# Patient Record
Sex: Female | Born: 1950 | Race: White | Hispanic: No | Marital: Single | Smoking: Never smoker
Health system: Southern US, Community
[De-identification: ages and names within clinical notes are randomized; demographics above are authoritative.]

---

## 1998-05-11 ENCOUNTER — Encounter: Admission: RE | Admit: 1998-05-11 | Discharge: 1998-05-11 | Payer: Self-pay | Admitting: *Deleted

## 2008-02-26 ENCOUNTER — Encounter
Admission: RE | Admit: 2008-02-26 | Discharge: 2008-02-27 | Payer: Self-pay | Admitting: Physical Medicine & Rehabilitation

## 2008-02-27 ENCOUNTER — Ambulatory Visit: Payer: Self-pay | Admitting: Physical Medicine & Rehabilitation

## 2009-01-07 ENCOUNTER — Encounter
Admission: RE | Admit: 2009-01-07 | Discharge: 2009-04-07 | Payer: Self-pay | Admitting: Physical Medicine & Rehabilitation

## 2009-01-11 ENCOUNTER — Ambulatory Visit: Payer: Self-pay | Admitting: Physical Medicine & Rehabilitation

## 2009-01-24 ENCOUNTER — Ambulatory Visit (HOSPITAL_BASED_OUTPATIENT_CLINIC_OR_DEPARTMENT_OTHER)
Admission: RE | Admit: 2009-01-24 | Discharge: 2009-01-24 | Payer: Self-pay | Admitting: Physical Medicine & Rehabilitation

## 2009-01-24 ENCOUNTER — Ambulatory Visit: Payer: Self-pay | Admitting: Diagnostic Radiology

## 2009-02-08 ENCOUNTER — Ambulatory Visit: Payer: Self-pay | Admitting: Physical Medicine & Rehabilitation

## 2009-03-14 ENCOUNTER — Ambulatory Visit: Payer: Self-pay | Admitting: Physical Medicine & Rehabilitation

## 2009-03-23 ENCOUNTER — Encounter
Admission: RE | Admit: 2009-03-23 | Discharge: 2009-04-05 | Payer: Self-pay | Admitting: Physical Medicine & Rehabilitation

## 2009-03-23 ENCOUNTER — Encounter
Admission: RE | Admit: 2009-03-23 | Discharge: 2009-03-23 | Payer: Self-pay | Admitting: Physical Medicine & Rehabilitation

## 2009-04-01 ENCOUNTER — Encounter
Admission: RE | Admit: 2009-04-01 | Discharge: 2009-05-31 | Payer: Self-pay | Admitting: Physical Medicine & Rehabilitation

## 2009-04-05 ENCOUNTER — Ambulatory Visit: Payer: Self-pay | Admitting: Physical Medicine & Rehabilitation

## 2009-05-31 ENCOUNTER — Ambulatory Visit: Payer: Self-pay | Admitting: Physical Medicine & Rehabilitation

## 2009-06-23 ENCOUNTER — Encounter
Admission: RE | Admit: 2009-06-23 | Discharge: 2009-08-29 | Payer: Self-pay | Admitting: Physical Medicine & Rehabilitation

## 2009-06-27 ENCOUNTER — Ambulatory Visit: Payer: Self-pay | Admitting: Physical Medicine & Rehabilitation

## 2009-08-29 ENCOUNTER — Ambulatory Visit: Payer: Self-pay | Admitting: Physical Medicine & Rehabilitation

## 2010-02-10 ENCOUNTER — Encounter
Admission: RE | Admit: 2010-02-10 | Discharge: 2010-05-11 | Payer: Self-pay | Admitting: Physical Medicine & Rehabilitation

## 2010-02-14 ENCOUNTER — Ambulatory Visit: Payer: Self-pay | Admitting: Physical Medicine & Rehabilitation

## 2010-02-14 ENCOUNTER — Ambulatory Visit (HOSPITAL_COMMUNITY)
Admission: RE | Admit: 2010-02-14 | Discharge: 2010-02-14 | Payer: Self-pay | Admitting: Physical Medicine & Rehabilitation

## 2010-03-13 ENCOUNTER — Ambulatory Visit: Payer: Self-pay | Admitting: Physical Medicine & Rehabilitation

## 2010-05-30 ENCOUNTER — Encounter
Admission: RE | Admit: 2010-05-30 | Discharge: 2010-05-30 | Payer: Self-pay | Source: Home / Self Care | Admitting: Physical Medicine & Rehabilitation

## 2010-10-18 ENCOUNTER — Encounter
Admission: RE | Admit: 2010-10-18 | Discharge: 2010-10-20 | Payer: Self-pay | Source: Home / Self Care | Attending: Physical Medicine & Rehabilitation | Admitting: Physical Medicine & Rehabilitation

## 2010-10-20 ENCOUNTER — Ambulatory Visit
Admission: RE | Admit: 2010-10-20 | Discharge: 2010-10-20 | Payer: Self-pay | Source: Home / Self Care | Attending: Physical Medicine & Rehabilitation | Admitting: Physical Medicine & Rehabilitation

## 2010-10-24 NOTE — Assessment & Plan Note (Signed)
REASON FOR VISIT:  Increased back pain.  SECONDARY COMPLAINTS:  Bilateral hip pain, right groin pain, bilateral elbow, and bilateral shoulder pain.  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  She has had a long absence from clinic.  She knows she has had some events in her personal life that have caused this absence.  She feels like her back pain is back.  The last set of injections helped a couple of months at least.  She also has additional complaints of right greater than left hip pain which is exacerbated when she tries to sleep on her side.  She also has some pain with ambulation. In addition, she has right greater than left groin pain.  She has pain in the anterior shin on the right side.  She has had to pad her orthosis with some foam, has lost some weight, and has poor fit.  She is also complaining of some decreased balance as well.  She has pain that interferes activity at a moderate level.  Her average pain is 8, worst pain is in the low back.  Her pain is worsened with prolonged sitting, improves with medications and injections.  Relief from meds is fair. Her pain meds include tramadol 50 mg t.i.d. she has taken some trazodone 50 mg at bedtime for sleep.  REVIEW OF SYSTEMS:  Also positive for numbness and tingling in the right thigh, dizziness, anxiety, night sweats, poor appetite, and sleep apnea.  SOCIAL HISTORY:  Married, smokes a pack in 2 days.  PHYSICAL EXAMINATION:  VITAL SIGNS:  Blood pressure 122/73, pulse 79, respirations 18, and O2 saturation 99% room air. GENERAL:  No acute distress.  Orientation x3.  Affect alert.  Gait is normal.  Other than having some mild right hip hiking, she does wear the right AFO. MUSCULOSKELETAL:  Examination of right lower extremity, no  skin breakdown.  She does have some prominence over anterior tibia.  She has an extremely atrophic leg below the knee and this is chronic.  She has poor fit of her AFO, and there is no skin breakdown.  Her back has some tenderness of the lumbosacral junction.  She has pain with extension greater than with flexion.  The straight leg raising test is negative. She has no pain with hip range of motion, but she does have tenderness over the right greater than left greater trochanter.  Her shoulders have negative impingement sign.  Full range of motion.  Does have some pain in the subacromial area.  No pain over the [facility] joint.  Elbows showed no evidence of swelling.  No evidence of bursitis.  No skin discoloration. Normal range of motion.  IMPRESSION: 1. Lumbar spondylosis resulting low back pain, chronic and severe.  We     will set her up for another set of medial branch blocks. 2. Post polio syndrome, chronic right foot drop.  She has lost some     additional muscle mass causing the AFO to fit poorly.  I will send     her back to [facility], attention Roland Earl, to revise orthosis which is about 62 years old. 3. Hip pain, trochanteric bursitis, right greater than left side.  I     do not detect any intra-articular pathology.  With range of motion  now it is also in the differential given her groin pain, we will     inject the greater trochanter on the right side today and observe     result. 4. Shoulder pain, mainly nocturnal, cannot elicit an impingement sign,     question whether this could be     representative of cuff tear.  For now, this is secondary complaint     but may need ultrasound of shoulder to further evaluate.  Discussed     with the patient and agrees with plan.  We will resume tramadol on     trazodone.     Erick Colace, M.D. Electronically Signed    AEK/MedQ D:  10/20/2010 12:51:10  T:  10/20/2010 23:20:48  Job #:   [id number]

## 2010-10-30 ENCOUNTER — Ambulatory Visit (HOSPITAL_BASED_OUTPATIENT_CLINIC_OR_DEPARTMENT_OTHER): Payer: BC Managed Care – HMO | Admitting: Physical Medicine & Rehabilitation

## 2010-10-30 ENCOUNTER — Ambulatory Visit: Payer: BC Managed Care – HMO | Attending: Physical Medicine & Rehabilitation

## 2010-10-30 DIAGNOSIS — M47817 Spondylosis without myelopathy or radiculopathy, lumbosacral region: Secondary | ICD-10-CM

## 2010-12-01 ENCOUNTER — Ambulatory Visit: Payer: BC Managed Care – HMO | Admitting: Physical Medicine & Rehabilitation

## 2010-12-01 ENCOUNTER — Encounter: Payer: BC Managed Care – HMO | Attending: Physical Medicine & Rehabilitation

## 2011-02-06 NOTE — Group Therapy Note (Signed)
REQUESTING PHYSICIAN:  Dr. Guss Bunde.   HISTORY:  Carrie Travis is a 60 year old female who was kindly referred by  Dr. Guss Bunde for evaluation of back pain, mainly right-sided.  She has a history of post-polio syndrome, diagnosed in 1995, followed in  [place] for a number of years but only intermittently.  Her polio  onset is back in 1951, right lower limb.  She has been using an AFO to  ambulate for a number of years, i.e., about 15.   Past history is significant for depression.  Has been seen by  psychiatry.   PAST SURGICAL HISTORY:  Ovarian cyst removal in 2008.  Some type of leg  surgery in 1960 to the right lower extremity at [facility].   SOCIAL HISTORY:  Architectural technologist at [employer].  One level  home.  Lives with her husband and daughter.  Smokes 5-8 cigarettes per  day.  Alcohol use about every other day.   Pain level is 7-8/10, described as aching, mainly right-sided, low back,  going to the scapula to the upper buttock area.  Left side, just over  the lower lumbar area.  Her pain interferes with activity at a moderate  level.  She can walk all day at work, using the AFO.  She climbs steps,  she drives.  She transfers alone.   REVIEW OF SYSTEMS:  Positive for numbness, tingling, dizziness, anxiety,  and night sweats.   FAMILY HISTORY:  Diabetes, high blood pressure.   PHYSICAL EXAMINATION:  Her blood pressure is 137/70, pulse 64,  respiratory rate 15, O2 sat 100% on room air.  GENERAL:  A well-developed female in no acute distress.  Orientation x3.  Affect is alert.  Gait has a limp.  Has a foot-slap-type gait without her AFO on.  She has  an atrophic right lower extremity, particularly below the knee.  Her  neck range of motion is normal.  Upper extremity strength and range of  motion is normal.  Upper extremity deep tendon reflexes are 2+ biceps,  triceps, brachial radialis.  In the right lower extremity, she has -3  hip flexion, 0  at the knee extensors, 0 at the ankle dorsiflexor and  plantar flexor, and -2 at the hamstrings on the right.  On the left  side, she has 5/5 strength in the same muscle groups.  She has  tenderness to palpation in the thoracic and paraspinals, more on the  right side than the left side.  She also had some rib pain around the  right lateral ribs.  She indicates she had a fall last week.  Sensation is mildly reduced, right foot compared to left foot.   IMPRESSION:  1. Chronic low back pain.  She has some associated right foot      numbness, question whether this is related.  Alternatively, could      have peroneal neuropathy based on chronic AFO usage; however, her      sensation deficits are both in the tibial and peroneal      distribution.  For this reason, I think MRI of the lumbar spine would be helpful, and  consider for lumbar injections once results are reviewed.  1. Balance disorder, mainly due to right lower extremity weakness.      May have some sensory component as well.  She would like to do a      free balance assessment, which is done at one of the [facility]  Physical Therapy sites, and depending on the results of this, might      go through a balance program.   I will keep you apprised of her progress.  Will check urine drug screen,  should we decide to initiate some type of narcotic analgesics, although  she is really not looking for these at the current time.   Thanks for this interesting consultation.      Carrie Travis, M.D.  Electronically Signed     AEK/MedQ  D:  02/27/2008 16:29:31  T:  02/27/2008 18:14:58  Job #:  [id number]   cc:   Guss Bunde  Fax: [phone]

## 2011-02-06 NOTE — Procedures (Signed)
NAME:  Carrie Travis, Carrie Travis                ACCOUNT NO.:  [id number]   MEDICAL RECORD NO.:  [id number]           PATIENT TYPE:   LOCATION:                                 FACILITY:   PHYSICIAN:  Erick Colace, M.D.DATE OF BIRTH:  04-08-1951   DATE OF PROCEDURE:  DATE OF DISCHARGE:                               OPERATIVE REPORT   PROCEDURE:  Left subacromial bursa injection.   INDICATIONS:  Subacromial bursitis.  Pain only partially responsive to  medication management and other conservative care.   Informed consent was obtained after describing the risks and benefits of  the procedure with the patient.  These include bleeding, bruising,  infection.  She elects to proceed and has given written consent.  The  patient placed in the seated position and posterolateral approach  utilized.  Area marked, prepped with Betadine, entered with 25-gauge 1-  1/2-inch needle, inserted to one-half depth, followed by injection of 1  mL of 40 mg/mL Depo-Medrol and 4 mL of 1% lidocaine.  The patient  tolerated the procedure well.  Pre and post injection vitals stable, and  post injection instructions given.  Referred to Physical Therapy for  some external rotation, strengthening, internal rotation, and  stretching.      Erick Colace, M.D.  Electronically Signed     AEK/MEDQ  D:  04/05/2009 16:10:96  T:  04/06/2009 00:54:33  Job:  [id number]

## 2011-02-06 NOTE — Assessment & Plan Note (Signed)
History of postpolio syndrome, diagnosed 1995.  Polio onset 1951 right  lower limb been using AFO to ambulate for about 15 years for fitted AFO,  progressive atrophy of the right calf muscle.  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  Smokes a pack per day.   Main complaint today is back pain mainly axial.  No lower extremity  radiating pattern.   She in addition has shoulder pain mainly on the left side greater than  right side.  She has pain when she sleeps on left side.   Her Oswestry score today is 38%.  I will fax this to [phone]  [facility]   Pain score is 6-7/10, mainly in the back as well as involving the right  knee.   REVIEW OF SYSTEMS:  Please see health and history form for 14-point  review.  Pertinent positives include trouble walking, dizziness,  anxiety, constipation, night sweats, shortness of breath.   SOCIAL:  In addition, married, lives with her husband.   PHYSICAL EXAMINATION:  VITAL SIGNS:  Blood pressure 131/67, pulse 79,  respirations 80, O2 sat 99% on room air.  GENERAL:  No acute distress.  Orientation x3.  Affect alert.  Gait is  with a limp.   She has calf atrophy on the right side.  Absent ankle range of motion  actively.  She has ankle contracture as well.   Knee extension is good, 4/5 strength left lower extremities, 5/5  strength upper extremities 5/5.  She has positive impingement sign of  left shoulder compared to the right side.  Negative AC joint  compression.   IMPRESSION:  1. Chronic low back pain.  She has pain mainly with extension of the      lumbar spine relatively relieved by flexion.  I think she may have      a lumbar facet syndrome and will set her up for medial branch      blocks.  2. Right foot drop, post polio syndrome with calf atrophy, poor fit of      ankle-foot orthosis.  I think she may benefit  from another ankle-      foot orthosis possibly carbon fiber versus plastic.  3. Shoulder pain likely glenohumeral versus subacromial bursitis.  We      will try some Voltaren gel and failing this may set her up for      injection.   Continue her prescription tramadol 50 t.i.d.  I reviewed her x-rays with  her of her shoulder and certain questions regarding the fluoroscopically  guided injection.      Erick Colace, M.D.  Electronically Signed    AEK/MedQ  D:  02/08/2009 16:19:25  T:  02/09/2009 05:10:30  Job #:  [id number]   cc:   Roland Earl  Fax: [phone]

## 2011-02-06 NOTE — Assessment & Plan Note (Signed)
Ms. Westendorf is a followup visit for a patient I saw about 1 year ago and  was lost to follow up over the course of 1 year.  She has a history  postpolio syndrome diagnosed in 1995.  She had polio onset in 1951,  right lower limb, has been using AFO to ambulate for about 15 years.  Now, she has had some poor fit with her AFO as she has had progressive  atrophy in the right calf muscle.   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  One-level home, lives with her husband  and daughter.  She is smoking a pack a day.  She states that she has had  a lot of family issues over the course of the last year.   She has been on hydrocodone 7.5/750 in the past due to recent surgery.  When I saw on March 10, 2008, we did urine drug screen which showed  propoxyphene is as expected.  She had some alprazolam, which was  prescribed by physician.   Her Oswestry score is 46%.  Her other issues include right hip pain as  well as pain in her back, pain in the shoulder region, her shoulder  region pain is mainly when she reaches across her body, her pain score  is in the 8/10 range, but currently 4.  Pain is worse with standing and  inactivity; improves with medications.   She has a review of systems positive for anxiety, dizziness, numbness,  weakness.   Her blood pressures 131/91, pulse 73, respirations 18, O2 sat 98% on  room air.  Physical examination, she has some AC joint discomfort to palpation,  cross adduction test positive, impingement signs are negative.  She has  reduced triceps strength at 4/5; biceps, deltoid, hand strength are 5-.  Her lower extremity has 5 bilateral hip flexors, knee extensors, but  only 2 at the right knee flexor and 1 at the ankle dorsiflexor, 2 at the  toe flexors and extensors on the right and 5 in the same muscle groups  on the left side.  Sensation is intact in bilateral upper and lower  extremities.  Deep tendon  reflexes are trace at  right knee, 2 at the  left knee, 1 at the right ankle, and 2 at the left ankle.   IMPRESSION:  Chronic low back pain, right foot numbness has been  chronic.  She does have some weakness in the right lower extremity which  I believe is due to postpolio syndrome, but this has some aching in the  right thigh which could either be related to her back or post polio  syndrome.  Certainly, peroneal neuropathy based on chronic AFO usage is  possibility as well especially since she has had to modify her brace due  to poor fit.   Balance disorder is multiple multifactorial.  We will send her to  physical therapy for this.   Shoulder pain, I believe this is AC joint arthritis, we will check 2-  views of the shoulder.   I will see her back in a month to review x-rays.  Have given her Rx for  tramadol 50 mg t.i.d. to see how this works for her.   I discussed plan with the patient, she is agreement.      Erick Colace, M.D.  Electronically Signed     AEK/MedQ  D:  01/11/2009 14:56:27  T:  01/12/2009 04:00:07  Job #:  [id number] 

## 2011-02-06 NOTE — Procedures (Signed)
NAME:  Carrie Travis, Carrie Travis                ACCOUNT NO.:  [id number]   MEDICAL RECORD NO.:  [id number]           PATIENT TYPE:   LOCATION:                                 FACILITY:   PHYSICIAN:  Erick Colace, M.D.DATE OF BIRTH:  [birth year]   DATE OF PROCEDURE:  03/14/2009  DATE OF DISCHARGE:                               OPERATIVE REPORT   PROCEDURES:  This is bilateral L5 dorsal ramus injection, bilateral L4  medial branch block, bilateral L3 medial branch block under fluoroscopic  guidance.   INDICATIONS:  Lumbar pain mainly with extension, no radicular component.   Informed consent obtained after describing risks and benefits of the  procedure with the patient's pain does limit her in terms of her  mobility and is only partially responsive to medication management.   The patient was placed prone on fluoroscopy table.  Betadine prep,  sterile drape, 25-gauge inch and a half needle was used to anesthetize  the skin and subcutaneous tissue with 1% lidocaine x1.5 mL at each of 6  sites.  Then, a 22-guage 3-1/2-inch spinal needle was inserted under  fluoroscopic guidance targeting the left S1 SAP sacral alar junction.  Bone contact made confirmed with lateral imaging.  Omnipaque 180 x0.5 mL  demonstrated no intravascular uptake, then 0.5 mL of solution containing  4 mg/mL of dexamethasone and 2 mL of 2% MPF lidocaine and then left L5  SAP transverse junction targeted bone contact made confirmed with  lateral imaging.  Omnipaque 180 under live fluoro demonstrated no  intravascular uptake, then 0.5 mL demonstrated no intravascular uptake.  Then, 0.5 end of dexamethasone-lidocaine solution was injected.  Then  left L4 SAP transverse process junction targeted bone contact made.  Omnipaque 180 under live fluoro demonstrated no intravascular uptake,  then 0.5 of dexamethasone-lidocaine solution was injected.  The same  procedure was repeated on the right side using same needle injectate  technique.  The patient tolerated the procedure well.  Her Oswestry  score was 42% today.  Pre- and post-injection vitals stable.  Post-  injection instructions given.  We will follow up on her pain diary.      Erick Colace, M.D.  Electronically Signed     AEK/MEDQ  D:  03/14/2009 11:45:30  T:  03/15/2009 01:07:14  Job:  [id number]

## 2011-12-31 IMAGING — CR DG CERVICAL SPINE COMPLETE 4+V
7 series · 7 of 7 positions shown · non-contrast
Comparison: None.

CLINICAL DATA: Neck pain.  Left shoulder pain.

CERVICAL SPINE - COMPLETE 4+ VIEW

[w c-spine lat]
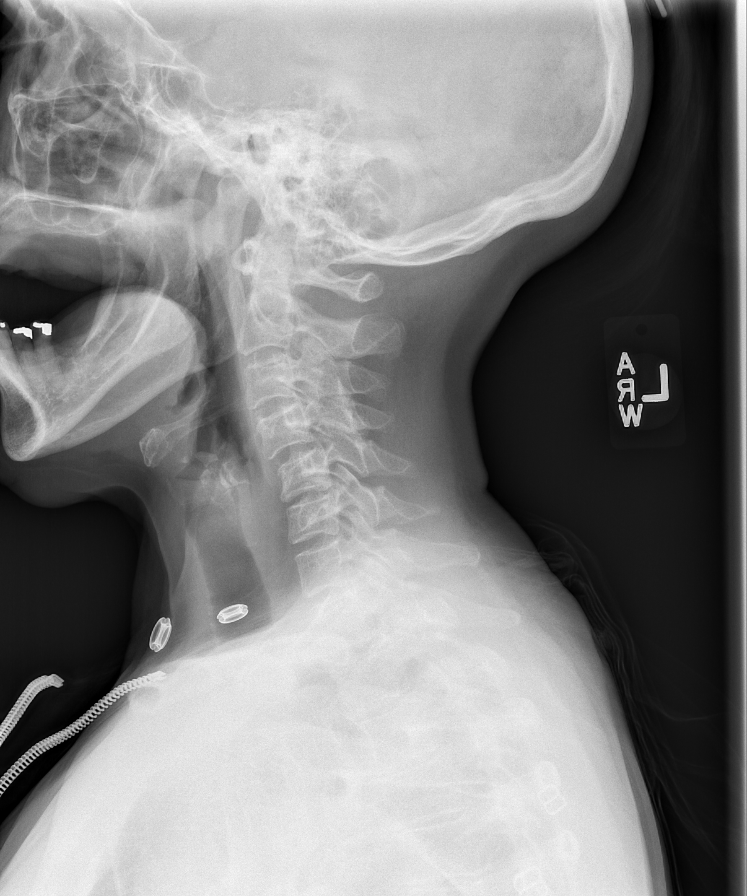

[w c-spine oblique (1 of 2)]
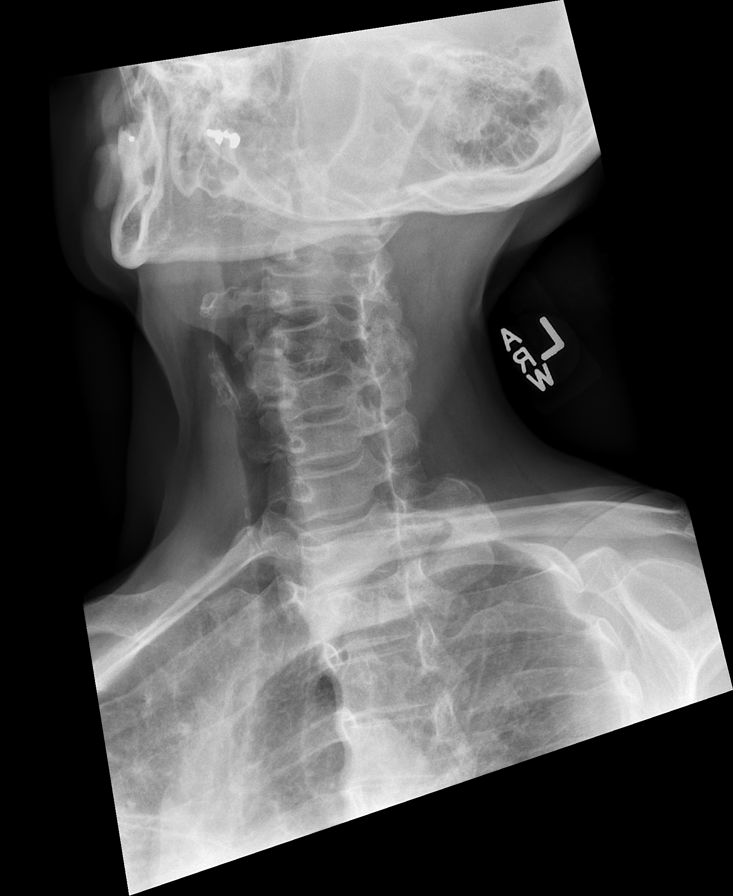

[w c-spine oblique (2 of 2)]
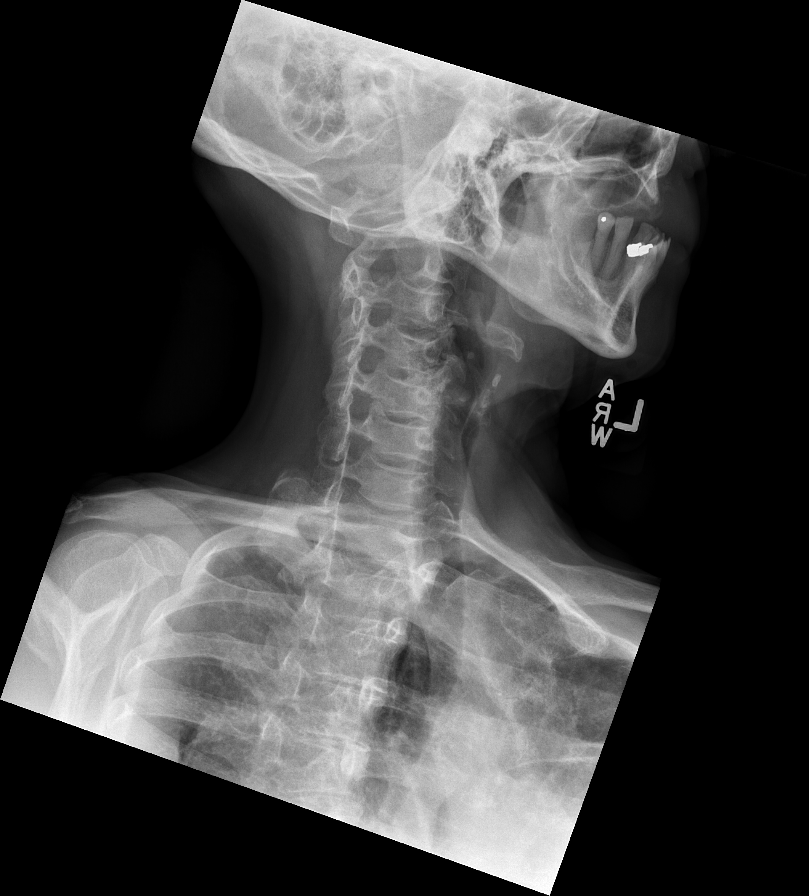

[w c-spine a.p. *]
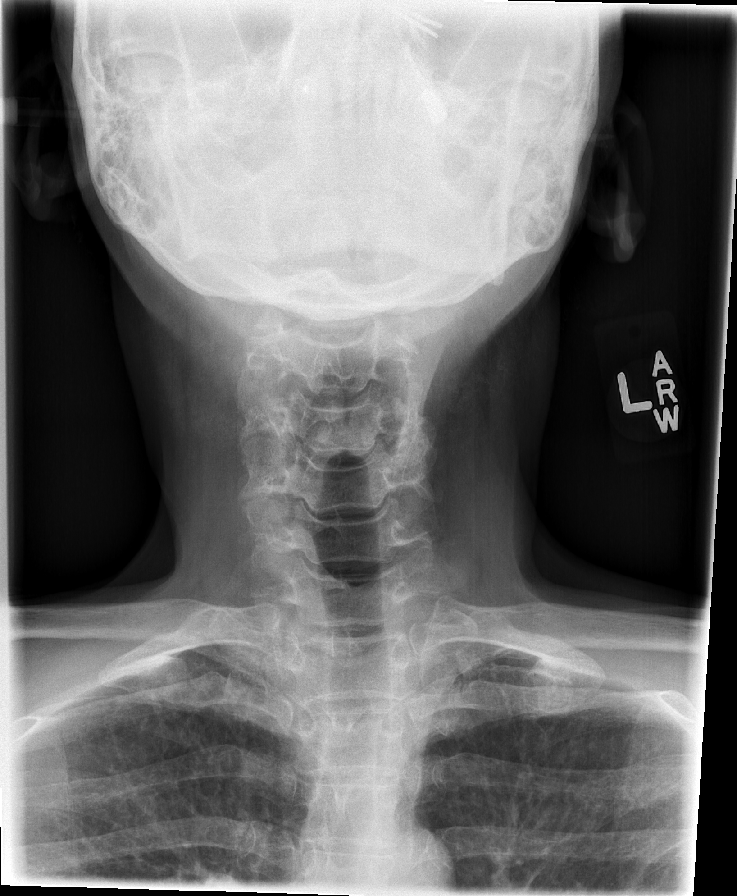

[w c-spine odontoid * (1 of 2)]
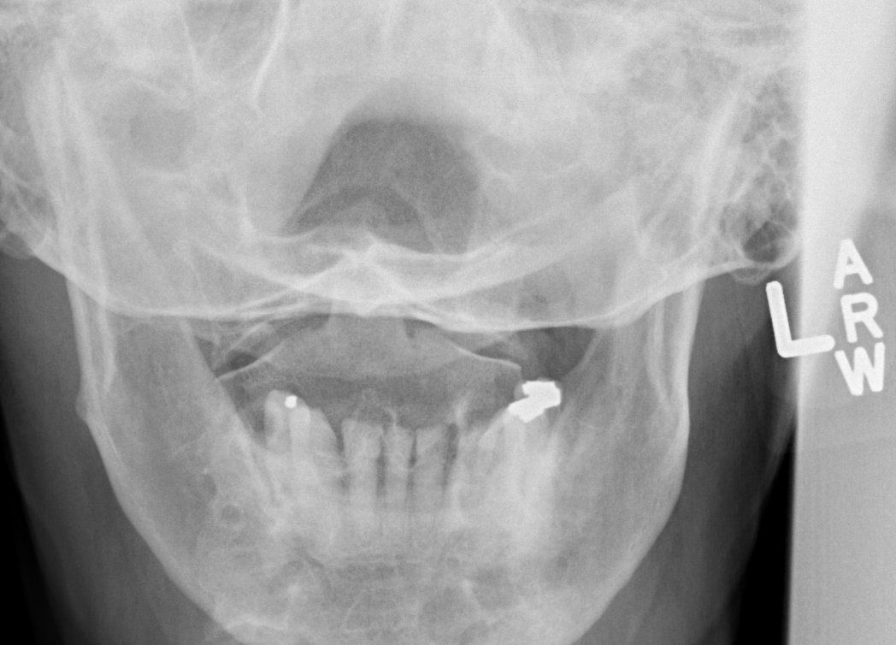

[w c-spine odontoid * (2 of 2)]
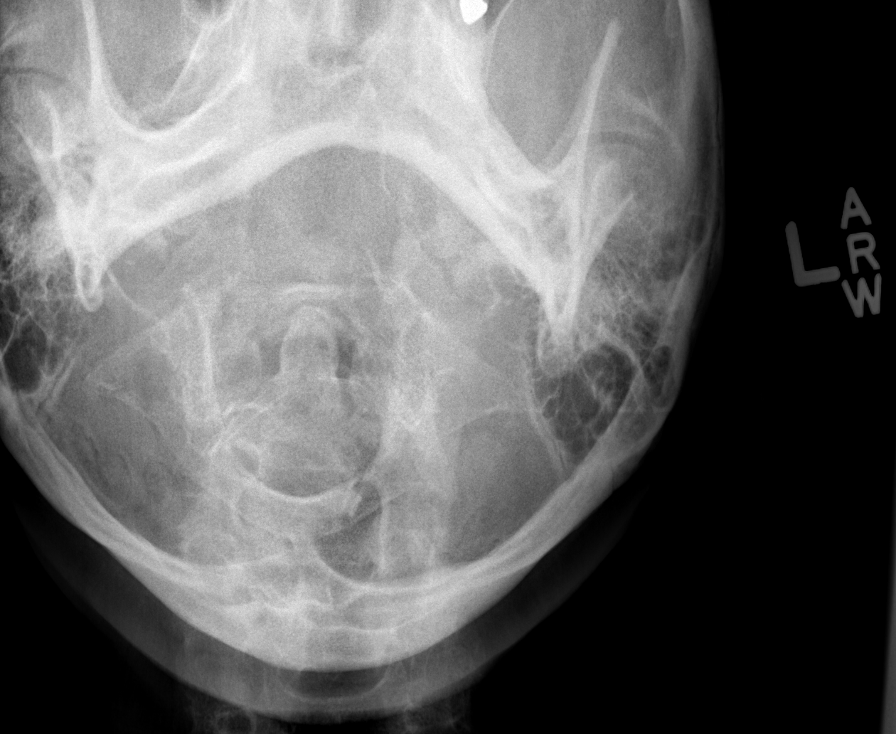

[w swimmers view *]
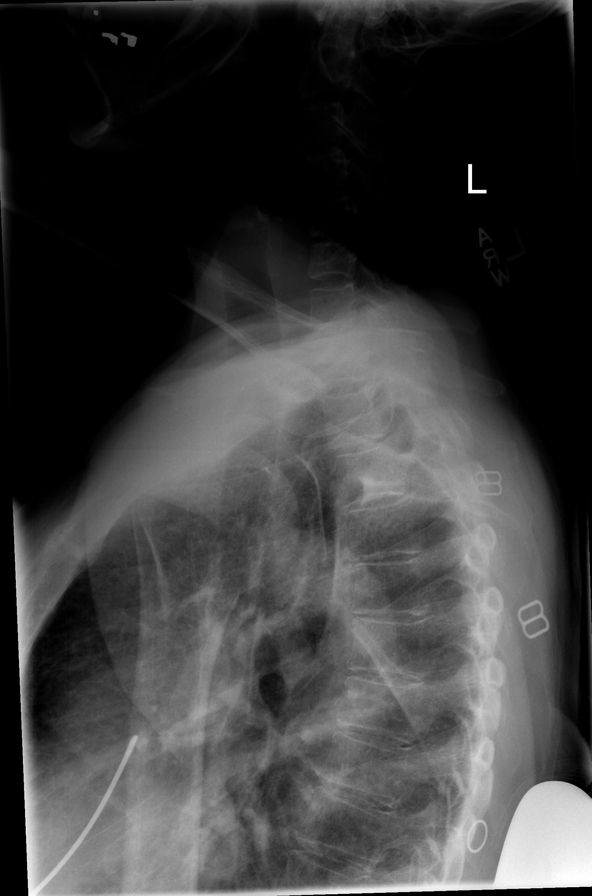

[7 of 7 positions shown; findings below may reference images not displayed]

FINDINGS: Anterior subluxation of C4 on C5 by 3 mm. This is
probably related to the advanced degenerative changes involving the
left C4-5 facet joint. Mild disc space narrowing and minimal
osteophytic formation at C5-6.  Neural foramen and central canal
appear widely patent.
IMPRESSION: Mild anterior subluxation of C4 on C5.  Facet arthropathy on the
left at C4-5.

## 2013-07-31 ENCOUNTER — Encounter: Payer: BC Managed Care – HMO | Attending: Physical Medicine & Rehabilitation

## 2013-07-31 ENCOUNTER — Ambulatory Visit (HOSPITAL_BASED_OUTPATIENT_CLINIC_OR_DEPARTMENT_OTHER): Payer: BC Managed Care – HMO | Admitting: Physical Medicine & Rehabilitation

## 2013-07-31 ENCOUNTER — Encounter: Payer: Self-pay | Admitting: Physical Medicine & Rehabilitation

## 2013-07-31 ENCOUNTER — Encounter (INDEPENDENT_AMBULATORY_CARE_PROVIDER_SITE_OTHER): Payer: Self-pay

## 2013-07-31 VITALS — BP 143/80 | HR 64 | Resp 14 | Ht 59.0 in | Wt 113.0 lb

## 2013-07-31 DIAGNOSIS — M47817 Spondylosis without myelopathy or radiculopathy, lumbosacral region: Secondary | ICD-10-CM

## 2013-07-31 DIAGNOSIS — M76899 Other specified enthesopathies of unspecified lower limb, excluding foot: Secondary | ICD-10-CM

## 2013-07-31 DIAGNOSIS — B91 Sequelae of poliomyelitis: Secondary | ICD-10-CM

## 2013-07-31 DIAGNOSIS — K219 Gastro-esophageal reflux disease without esophagitis: Secondary | ICD-10-CM | POA: Insufficient documentation

## 2013-07-31 DIAGNOSIS — M25519 Pain in unspecified shoulder: Secondary | ICD-10-CM | POA: Insufficient documentation

## 2013-07-31 DIAGNOSIS — M7061 Trochanteric bursitis, right hip: Secondary | ICD-10-CM | POA: Insufficient documentation

## 2013-07-31 DIAGNOSIS — M25512 Pain in left shoulder: Secondary | ICD-10-CM

## 2013-07-31 MED ORDER — TRAMADOL HCL 50 MG PO TABS: 50.0000 mg | ORAL_TABLET | Freq: Three times a day (TID) | ORAL | Status: DC

## 2013-07-31 NOTE — Progress Notes (Addendum)
Subjective:    Patient ID: Carrie Travis, female    DOB: 1951/03/30, 62 y.o.   MRN: [id number]  HPI  HISTORY:  Carrie Travis is a 62 year old female who was kindly referred by   Dr. Guss Bunde for evaluation of back pain, mainly right-sided.   She has a history of post-polio syndrome, diagnosed in 1995, followed in   [place] for a number of years but only intermittently.  Her polio   onset is back in 1951, right lower limb.  She has been using an AFO to   ambulate for a number of years, i.e., about 15.   Seen in my physical medicine clinic starting in 2009 and last visit was in 2012. Diagnosed with lumbar   spondylosis responsive to medial branch blocks bilateral L5 dorsal   ramus, bilateral L4 medial branch, and bilateral L3 medial branch, last   performed 10/30/2010.  She has had a long absence from clinic.  She states that money has been an issue and is now working 2 jobs She feels like her back pain is back.  The last set of   injections helped a couple of months at least.  She also has additional   complaints of right greater than left hip pain which is exacerbated when   she tries to sleep on her side.  She also has some pain with ambulation.   In addition, she has right greater than left groin pain.  She has pain   in the anterior shin on the right side.  She has had to pad her orthosis   with some foam, has lost some weight, and has poor fit.  She is also   complaining of some decreased balance as well.  She has pain that   interferes activity at a moderate level.  Her average pain is 8, worst   pain is in the low back.  Her pain is worsened with prolonged sitting,   improves with medications and injections.  Relief from meds is fair.   Her previous pain meds included tramadol 50 mg t.i.d. she has no longer been taking this. She takes over-the-counter analgesics such as ibuprofen but her acid reflux has been flaring up again. I discussed that this may be the cause.   Left  shoulder pain is new no trauma. Pain increases with overhead activities. Patient notes some mild weakness in the right upper extremity related to her polio.       Past history is significant for depression.  Has been seen by   psychiatry.   Working at Production assistant, radio Also at [employer]     PAST SURGICAL HISTORY:  Ovarian cyst removal in 2008.  Some type of leg   surgery in 1960 to the right lower extremity at [facility].      SOCIAL HISTORY:  Architectural technologist at [employer].  One level   home.  Lives with her husband and daughter.  Smokes 5-8 cigarettes per   day.    Pain Inventory Average Pain 8 Pain Right Now 8 My pain is burning, tingling and aching  In the last 24 hours, has pain interfered with the following? General activity 7 Relation with others 6 Enjoyment of life 6 What TIME of day is your pain at its worst? varies Sleep (in general) Fair  Pain is worse with: sitting, inactivity, standing and some activites Pain improves with: rest Relief from Meds: 0  Mobility walk without assistance ability to climb steps?  yes  do you drive?  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  no  Physicians involved in your care Any changes since last visit?  no   History reviewed. No pertinent family history. History   Social History  . Marital Status: Single    Spouse Name: N/A    Number of Children: N/A  . Years of Education: N/A   Social History Main Topics  . Smoking status: Never Smoker   . Smokeless tobacco: Never Used  . Alcohol Use: None  . Drug Use: None  . Sexual Activity: None   Other Topics Concern  . None   Social History Narrative  . None   History reviewed. No pertinent past surgical history. History reviewed. No pertinent past medical  history. BP 143/80  Pulse 64  Resp 14  Ht 4\' 11"  (1.499 m)  Wt 113 lb (51.256 kg)  BMI 22.81 kg/m2  SpO2 99%     Review of Systems  Constitutional: Positive for chills and diaphoresis.  Musculoskeletal: Positive for back pain.  Neurological: Positive for weakness and numbness.  Psychiatric/Behavioral: Positive for dysphoric mood. The patient is nervous/anxious.   All other systems reviewed and are negative.       Objective:   Physical Exam  Nursing note and vitals reviewed. Constitutional: She is oriented to person, place, and time. She appears well-developed and well-nourished.  HENT:  Head: Normocephalic and atraumatic.  Eyes: Conjunctivae and EOM are normal. Pupils are equal, round, and reactive to light.  Neck: Normal range of motion.  Musculoskeletal:       Right hip: She exhibits bony tenderness.       Left knee: She exhibits normal range of motion, no swelling, no effusion, no deformity, normal alignment, no LCL laxity and normal meniscus. Tenderness found. Lateral joint line tenderness noted.       Lumbar back: She exhibits decreased range of motion and pain. She exhibits no tenderness.       Legs: Decreased lumbar extension with end range pain  Negative grind test bilateral knees  Neurological: She is alert and oriented to person, place, and time. No sensory deficit. Gait abnormal.  Motor strength is 4/5 in the right deltoid, 5/5 in the biceps and triceps 4/5 and finger flexors Object upper extremity 5/5 in the deltoid bicep triceps and finger flexors Motor strength right hip flexors 3 minus right knee extensors 3+ right ankle dorsiflexor trace Left hip flexors 5 left knee extensor and ankle dorsiflexor plantar flexor 5/5  Psychiatric: She has a normal mood and affect.          Assessment & Plan:  1. Lumbar spondylosis with increasing pain, resume tramadol 50 g 3 times a day, schedule for lumbar medial branch blocks.  2. Right hip trochanteric bursitis,  pain interferes with sleep. Unable to take nonsteroidal secondary to acid reflux. Also they have not been helping hip pain. Will re injected day has had previous good results. Start on hip stretching and strengthening program  3. Left shoulder pain, no clear cut impingement, seems to be intermittent, will monitor for now. Consider Pilates shoulder exercises  4. Post polio syndrome may have some progression of weakness but not severe at this point. Continues to have atrophy of the right calf and anterior tibial muscle. May need orthotic revised  Right Trochanteric bursa injection  without ultrasound guidance  Indication Right Trochanteric bursitis. Exam has tenderness over the greater trochanter of the hip. Pain has not responded to conservative care such as exercise therapy and oral medications. Pain interferes with sleep or with mobility Informed consent was obtained after describing risks and benefits of the procedure with the patient these include bleeding bruising and infection. Patient has signed written consent form. Patient placed in a lateral decubitus position with the affected hip superior. Point of maximal pain was palpated marked and prepped with Betadine and entered with a needle to bone contact. Needle slightly withdrawn then 6mg  of betamethasone with 4 cc 1% lidocaine were injected. Patient tolerated procedure well. Post procedure instructions given.

## 2013-07-31 NOTE — Patient Instructions (Signed)
Hip Exercises  RANGE OF MOTION (ROM) AND STRETCHING EXERCISES   These exercises may help you when beginning to rehabilitate your injury. Doing them too aggressively can worsen your condition. Complete them slowly and gently. Your symptoms may resolve with or without further involvement from your physician, physical therapist or athletic trainer. While completing these exercises, remember:   · Restoring tissue flexibility helps normal motion to return to the joints. This allows healthier, less painful movement and activity.  · An effective stretch should be held for at least 30 seconds.  · A stretch should never be painful. You should only feel a gentle lengthening or release in the stretched tissue. If these stretches worsen your symptoms even when done gently, consult your physician, physical therapist or athletic trainer.  STRETCH Hamstrings, Supine   · Lie on your back. Loop a belt or towel over the ball of your right / left foot.  · Straighten your right / left knee and slowly pull on the belt to raise your leg. Do not allow the right / left knee to bend. Keep your opposite leg flat on the floor.  · Raise the leg until you feel a gentle stretch behind your right / left knee or thigh. Hold this position for __________ seconds.  Repeat __________ times. Complete this stretch __________ times per day.   STRETCH - Hip Rotators   · Lie on your back on a firm surface. Grasp your right / left knee with your right / left hand and your ankle with your opposite hand.  · Keeping your hips and shoulders firmly planted, gently pull your right / left knee and rotate your lower leg toward your opposite shoulder until you feel a stretch in your buttocks.  · Hold this stretch for __________ seconds.  Repeat this stretch __________ times. Complete this stretch __________ times per day.  STRETCH - Hamstrings/Adductors, V-Sit   · Sit on the floor with your legs extended in a large "V," keeping your knees straight.  · With your head  and chest upright, bend at your waist reaching for your right foot to stretch your left adductors.  · You should feel a stretch in your left inner thigh. Hold for __________ seconds.  · Return to the upright position to relax your leg muscles.  · Continuing to keep your chest upright, bend straight forward at your waist to stretch your hamstrings.  · You should feel a stretch behind both of your thighs and/or knees. Hold for __________ seconds.  · Return to the upright position to relax your leg muscles.  · Repeat steps 2 through 4 for opposite leg.  Repeat __________ times. Complete this exercise __________ times per day.   STRETCHING - Hip Flexors, Lunge  · Half kneel with your right / left knee on the floor and your opposite knee bent and directly over your ankle.  · Keep good posture with your head over your shoulders. Tighten your buttocks to point your tailbone downward; this will prevent your back from arching too much.  · You should feel a gentle stretch in the front of your thigh and/or hip. If you do not feel any resistance, slightly slide your opposite foot forward and then slowly lunge forward so your knee once again lines up over your ankle. Be sure your tailbone remains pointed downward.  · Hold this stretch for __________ seconds.  Repeat __________ times. Complete this stretch __________ times per day.  STRENGTHENING EXERCISES  These exercises may help you   when beginning to rehabilitate your injury. They may resolve your symptoms with or without further involvement from your physician, physical therapist or athletic trainer. While completing these exercises, remember:   · Muscles can gain both the endurance and the strength needed for everyday activities through controlled exercises.  · Complete these exercises as instructed by your physician, physical therapist or athletic trainer. Progress the resistance and repetitions only as guided.  · You may experience muscle soreness or fatigue, but the pain  or discomfort you are trying to eliminate should never worsen during these exercises. If this pain does worsen, stop and make certain you are following the directions exactly. If the pain is still present after adjustments, discontinue the exercise until you can discuss the trouble with your clinician.  STRENGTH - Hip Extensors, Bridge   · Lie on your back on a firm surface. Bend your knees and place your feet flat on the floor.  · Tighten your buttocks muscles and lift your bottom off the floor until your trunk is level with your thighs. You should feel the muscles in your buttocks and back of your thighs working. If you do not feel these muscles, slide your feet 1-2 inches further away from your buttocks.  · Hold this position for __________ seconds.  · Slowly lower your hips to the starting position and allow your buttock muscles relax completely before beginning the next repetition.  · If this exercise is too easy, you may cross your arms over your chest.  Repeat __________ times. Complete this exercise __________ times per day.   STRENGTH - Hip Abductors, Straight Leg Raises   Be aware of your form throughout the entire exercise so that you exercise the correct muscles. Sloppy form means that you are not strengthening the correct muscles.  · Lie on your side so that your head, shoulders, knee and hip line up. You may bend your lower knee to help maintain your balance. Your right / left leg should be on top.  · Roll your hips slightly forward, so that your hips are stacked directly over each other and your right / left knee is facing forward.  · Lift your top leg up 4-6 inches, leading with your heel. Be sure that your foot does not drift forward or that your knee does not roll toward the ceiling.  · Hold this position for __________ seconds. You should feel the muscles in your outer hip lifting (you may not notice this until your leg begins to tire).  · Slowly lower your leg to the starting position. Allow the  muscles to fully relax before beginning the next repetition.  Repeat __________ times. Complete this exercise __________ times per day.   STRENGTH - Hip Adductors, Straight Leg Raises   · Lie on your side so that your head, shoulders, knee and hip line up. You may place your upper foot in front to help maintain your balance. Your right / left leg should be on the bottom.  · Roll your hips slightly forward, so that your hips are stacked directly over each other and your right / left knee is facing forward.  · Tense the muscles in your inner thigh and lift your bottom leg 4-6 inches. Hold this position for __________ seconds.  · Slowly lower your leg to the starting position. Allow the muscles to fully relax before beginning the next repetition.  Repeat __________ times. Complete this exercise __________ times per day.   STRENGTH - Quadriceps, Straight Leg Raises     Quality counts! Watch for signs that the quadriceps muscle is working to insure you are strengthening the correct muscles and not "cheating" by substituting with healthier muscles.  · Lay on your back with your right / left leg extended and your opposite knee bent.  · Tense the muscles in the front of your right / left thigh. You should see either your knee cap slide up or increased dimpling just above the knee. Your thigh may even quiver.  · Tighten these muscles even more and raise your leg 4 to 6 inches off the floor. Hold for right / left seconds.  · Keeping these muscles tense, lower your leg.  · Relax the muscles slowly and completely in between each repetition.  Repeat __________ times. Complete this exercise __________ times per day.   STRENGTH - Hip Abductors, Standing  · Tie one end of a rubber exercise band/tubing to a secure surface (table, pole) and tie a loop at the other end.  · Place the loop around your right / left ankle. Keeping your ankle with the band directly opposite of the secured end, step away until there is tension in the  tube/band.  · Hold onto a chair as needed for balance.  · Keeping your back upright, your shoulders over your hips, and your toes pointing forward, lift your right / left leg out to your side. Be sure to lift your leg with your hip muscles. Do not "throw" your leg or tip your body to lift your leg.  · Slowly and with control, return to the starting position.  Repeat exercise __________ times. Complete this exercise __________ times per day.   STRENGTH  Quadriceps, Squats  · Stand in a door frame so that your feet and knees are in line with the frame.  · Use your hands for balance, not support, on the frame.  · Slowly lower your weight, bending at the hips and knees. Keep your lower legs upright so that they are parallel with the door frame. Squat only within the range that does not increase your knee pain. Never let your hips drop below your knees.  · Slowly return upright, pushing with your legs, not pulling with your hands.  Document Released: 09/28/2005 Document Revised: 12/03/2011 Document Reviewed: 12/23/2008  [employer]® Patient Information ©2014 [employer].

## 2013-09-03 ENCOUNTER — Encounter: Payer: Self-pay | Admitting: Physical Medicine & Rehabilitation

## 2013-09-03 ENCOUNTER — Ambulatory Visit (HOSPITAL_BASED_OUTPATIENT_CLINIC_OR_DEPARTMENT_OTHER): Payer: BC Managed Care – HMO | Admitting: Physical Medicine & Rehabilitation

## 2013-09-03 ENCOUNTER — Encounter: Payer: BC Managed Care – HMO | Attending: Physical Medicine & Rehabilitation

## 2013-09-03 VITALS — BP 137/77 | HR 70 | Resp 14 | Ht <= 58 in | Wt 110.8 lb

## 2013-09-03 DIAGNOSIS — K219 Gastro-esophageal reflux disease without esophagitis: Secondary | ICD-10-CM | POA: Insufficient documentation

## 2013-09-03 DIAGNOSIS — M47817 Spondylosis without myelopathy or radiculopathy, lumbosacral region: Secondary | ICD-10-CM

## 2013-09-03 DIAGNOSIS — B91 Sequelae of poliomyelitis: Secondary | ICD-10-CM | POA: Insufficient documentation

## 2013-09-03 DIAGNOSIS — M25519 Pain in unspecified shoulder: Secondary | ICD-10-CM | POA: Insufficient documentation

## 2013-09-03 DIAGNOSIS — M76899 Other specified enthesopathies of unspecified lower limb, excluding foot: Secondary | ICD-10-CM | POA: Insufficient documentation

## 2013-09-03 MED ORDER — TRAMADOL HCL 50 MG PO TABS: 50.0000 mg | ORAL_TABLET | Freq: Three times a day (TID) | ORAL | Status: DC

## 2013-09-03 NOTE — Patient Instructions (Addendum)
Lumbar medial branch blocks were performed. This is to help diagnose the cause of the low back pain. It is important that you keep track of your pain for the first day or 2 after injection. This injection can give you temporary relief that lasts for hours or up to several months. There is no way to predict duration of pain relief.  Please try to compare your pain after injection to before the injection.  If this injection gives you  temporary relief there may be another longer-lasting procedure that may be beneficial call radiofrequency ablation  Will see you in 6 months  If another injection is needed please call for appointment.   Please call your pharmacy if you need another prescription for tramadol

## 2013-09-03 NOTE — Progress Notes (Signed)
  PROCEDURE RECORD [facility]   Name: Carrie Travis DOB:April 01, 1951 MRN: [id number]  Date:09/03/2013  Physician: Claudette Laws, MD    Nurse/CMA: Kaedan Richert RN  Allergies: No Known Allergies  Consent Signed: yes  Is patient diabetic? no  CBG today?   Pregnant: no LMP: No LMP recorded. (age 64-55) No longer having periods  Anticoagulants: no Anti-inflammatory: no Antibiotics: no  Procedure: Medial Branch Blocks Bilateral Position: Prone Start Time: 2:00  End Time: 2:13 Fluoro Time: 41.4 seconds   RN/CMA Designer, multimedia    Time 1:44p 2:18p    BP 137/77 156/89    Pulse 70 76    Respirations 14 14    O2 Sat 99 100    S/S 6 6    Pain Level 8/10 4/10     D/C home with husband Roanna Epley, patient A & O X 3, D/C instructions reviewed, and sits independently.

## 2013-09-03 NOTE — Progress Notes (Signed)
Bilateral Lumbar L3, L4  medial branch blocks and L 5 dorsal ramus injection under fluoroscopic guidance  Indication: Lumbar pain which is not relieved by medication management or other conservative care and interfering with self-care and mobility.  Informed consent was obtained after describing risks and benefits of the procedure with the patient, this includes bleeding, infection, paralysis and medication side effects.  The patient wishes to proceed and has given written consent.  The patient was placed in prone position.  The lumbar area was marked and prepped with Betadine.  One mL of 1% lidocaine was injected into each of 6 areas into the skin and subcutaneous tissue.  Then a 22-gauge 3.5 inch spinal needle was inserted targeting the junction of the left S1 superior articular process and sacral ala junction. Needle was advanced under fluoroscopic guidance.  Bone contact was made.  Omnipaque 180 was injected x 0.5 mL demonstrating no intravascular uptake.  Then a solution containing one mL of 4 mg per mL dexamethasone and 3 mL of 2% MPF lidocaine was injected x 0.5 mL.  Then the left L5 superior articular process in transverse process junction was targeted.  Bone contact was made.  Omnipaque 180 was injected x 0.5 mL demonstrating no intravascular uptake. Then a solution containing one mL of 4 mg per mL dexamethasone and 3 mL of 2% MPF lidocaine was injected x 0.5 mL.  Then the left L4 superior articular process in transverse process junction was targeted.  Bone contact was made.  Omnipaque 180 was injected x 0.5 mL demonstrating no intravascular uptake.  Then a solution containing one mL of 4 mg per mL dexamethasone and 3 mL if 2% MPF lidocaine was injected x 0.5 mL.  This same procedure was performed on the right side using the same needle, technique and injectate.  Patient tolerated procedure well.  Post procedure instructions were given.  Pre injection 7-8/10 pain Post injection 4/10 pain

## 2013-11-30 ENCOUNTER — Telehealth: Payer: Self-pay

## 2013-11-30 MED ORDER — TRAMADOL HCL 50 MG PO TABS: 50.0000 mg | ORAL_TABLET | Freq: Three times a day (TID) | ORAL | Status: DC

## 2013-11-30 NOTE — Telephone Encounter (Signed)
Patient called requesting a refill on Tramadol. Refill called in to pharmacy. Patient is aware.  

## 2013-12-03 ENCOUNTER — Other Ambulatory Visit: Payer: Self-pay | Admitting: Physical Medicine & Rehabilitation

## 2014-03-02 ENCOUNTER — Encounter: Payer: Self-pay | Admitting: Physical Medicine & Rehabilitation

## 2014-03-02 ENCOUNTER — Encounter: Payer: BC Managed Care – HMO | Attending: Physical Medicine & Rehabilitation

## 2014-03-02 ENCOUNTER — Ambulatory Visit (HOSPITAL_BASED_OUTPATIENT_CLINIC_OR_DEPARTMENT_OTHER): Payer: BC Managed Care – HMO | Admitting: Physical Medicine & Rehabilitation

## 2014-03-02 VITALS — BP 153/87 | HR 85 | Resp 14 | Ht 59.0 in | Wt 110.4 lb

## 2014-03-02 DIAGNOSIS — M654 Radial styloid tenosynovitis [de Quervain]: Secondary | ICD-10-CM

## 2014-03-02 DIAGNOSIS — M549 Dorsalgia, unspecified: Secondary | ICD-10-CM | POA: Insufficient documentation

## 2014-03-02 DIAGNOSIS — M25559 Pain in unspecified hip: Secondary | ICD-10-CM | POA: Insufficient documentation

## 2014-03-02 DIAGNOSIS — M6281 Muscle weakness (generalized): Secondary | ICD-10-CM

## 2014-03-02 DIAGNOSIS — M47817 Spondylosis without myelopathy or radiculopathy, lumbosacral region: Secondary | ICD-10-CM | POA: Insufficient documentation

## 2014-03-02 DIAGNOSIS — B91 Sequelae of poliomyelitis: Secondary | ICD-10-CM

## 2014-03-02 DIAGNOSIS — M25519 Pain in unspecified shoulder: Secondary | ICD-10-CM | POA: Insufficient documentation

## 2014-03-02 DIAGNOSIS — M25539 Pain in unspecified wrist: Secondary | ICD-10-CM | POA: Insufficient documentation

## 2014-03-02 MED ORDER — TRAMADOL HCL 50 MG PO TABS: 50.0000 mg | ORAL_TABLET | Freq: Three times a day (TID) | ORAL | Status: DC

## 2014-03-02 MED ORDER — MELOXICAM 7.5 MG PO TABS
7.5000 mg | ORAL_TABLET | Freq: Every day | ORAL | Status: DC
Start: 2014-03-02 — End: 2015-03-14

## 2014-03-02 NOTE — Patient Instructions (Signed)
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

## 2014-03-02 NOTE — Progress Notes (Signed)
Subjective:    Patient ID: Carrie Travis, female    DOB: 1950-12-01, 63 y.o.   MRN: [id number] Carrie Travis is a 63 year old female who was kindly referred by    Dr. Guss BundeWilliam Bockenek for evaluation of back pain, mainly right-sided.    She has a history of post-polio syndrome, diagnosed in 1995, followed in    [place] for a number of years but only intermittently.  Her polio    onset is back in 1951, right lower limb.  She has been using an AFO to    ambulate for a number of years, i.e., about 15.   Seen in my physical medicine clinic starting in 2009 and last visit was in 2012. Diagnosed with lumbar    spondylosis responsive to medial branch blocks bilateral L5 dorsal    ramus, bilateral L4 medial branch, and bilateral L3 medial branch, last    performed 10/30/2010.  She has had a long absence from clinic.  She states that money has been an issue and is now working 2 jobs She feels like her back pain is back.  The last set of    injections helped a couple of months at least.  She also has additional    complaints of right greater than left hip pain which is exacerbated when    she tries to sleep on her side.  She also has some pain with ambulation.    In addition, she has right greater than left groin pain.  She has pain    in the anterior shin on the right side.  She has had to pad her orthosis    with some foam, has lost some weight, and has poor fit.  She is also    complaining of some decreased balance as well.  She has pain that    interferes activity at a moderate level.  Her average pain is 8, worst    pain is in the low back.  Her pain is worsened with prolonged sitting,    improves with medications and injections.  Relief from meds is fair.    Her previous pain meds included tramadol 50 mg t.i.d. she has no longer been taking this. She takes over-the-counter analgesics such as ibuprofen but her acid reflux has been flaring up again. I discussed that this may be the cause.      Left shoulder pain is new no trauma. Pain increases with overhead activities. Patient notes some mild weakness in the right upper extremity related to her polio.   HPI Increasing pain at the right wrist. Works as a Conservation officer, naturecashier. She use of the right hand to scan items. She has noticed some swelling at the right wrist. No history of trauma or falls.  Pain Inventory Average Pain 7 Pain Right Now 7 My pain is dull and aching  In the last 24 hours, has pain interfered with the following? General activity 5 Relation with others 5 Enjoyment of life 7 What TIME of day is your pain at its worst? morning and daytime Sleep (in general) Fair  Pain is worse with: inactivity, standing and some activites Pain improves with: medication and injections Relief from Meds: 6  Mobility walk without assistance how many minutes can you walk? unlimited ability to climb steps?  no do you drive?  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  no  Physicians involved in  your care Any changes since last visit?  no   History reviewed. No pertinent family history. History   Social History  . Marital Status: Single    Spouse Name: N/A    Number of Children: N/A  . Years of Education: N/A   Social History Main Topics  . Smoking status: Never Smoker   . Smokeless tobacco: Never Used  . Alcohol Use: None  . Drug Use: None  . Sexual Activity: None   Other Topics Concern  . None   Social History Narrative  . None   No past surgical history on file. History reviewed. No pertinent past medical history. BP 153/87  Pulse 85  Resp 14  Ht 4\' 11"  (1.499 m)  Wt 110 lb 6.4 oz (50.077 kg)  BMI 22.29 kg/m2  SpO2 97%  Opioid Risk Score:   Fall Risk Score: Low Fall Risk (0-5 points) (educated and handout for fall prevention in the home given) Review of Systems  Musculoskeletal: Positive for  gait problem.       Hip pain   All other systems reviewed and are negative.      Objective:   Physical Exam Tenderness to palpation over the carpal metacarpal joint on the right side. Also tenderness over the anatomic snuff box of the right side. Swelling at the anatomic snuff box on the right side. Positive Finkelstein's maneuver No pain with resisted wrist extension or with resisted wrist flexion. No wrist joint swelling. No hand swelling or skin discoloration. Sensation is normal to touch in the right hand in the right fingers  Hips have no tenderness to palpation. Lumbar spine range of motion is normal flexion with restricted extension mild pain with extension.       Assessment & Plan:  1. Lumbar spondylosis with increasing pain, continue tramadol 50 g 3 times a day, improved after lumbar medial branch blocks. No need for reinjection at the current time  2. Right hip trochanteric bursitis, overall improved no need for reinjection 3. Post polio syndrome may have some progression of weakness but not severe at this point. Continues to have atrophy of the right calf and anterior tibial muscle. May need orthotic revised  #4. DeQuervain's tenosynovitis: Patient has had symptoms for about 2 months. 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 Will need to put in a thumb spica splint and start on Mobic for 2 weeks. 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 If not better at next visit in 6 weeks will do injection under ultrasound guidance

## 2014-03-05 ENCOUNTER — Ambulatory Visit: Payer: BC Managed Care – HMO | Admitting: Physical Medicine & Rehabilitation

## 2014-04-15 ENCOUNTER — Ambulatory Visit: Payer: BC Managed Care – HMO | Admitting: Physical Medicine & Rehabilitation

## 2014-05-25 ENCOUNTER — Encounter: Payer: Self-pay | Admitting: Physical Medicine & Rehabilitation

## 2014-05-25 ENCOUNTER — Encounter: Payer: BC Managed Care – HMO | Attending: Physical Medicine & Rehabilitation

## 2014-05-25 ENCOUNTER — Ambulatory Visit (HOSPITAL_BASED_OUTPATIENT_CLINIC_OR_DEPARTMENT_OTHER): Payer: BC Managed Care – HMO | Admitting: Physical Medicine & Rehabilitation

## 2014-05-25 VITALS — BP 114/69 | HR 78 | Resp 16 | Wt 107.8 lb

## 2014-05-25 DIAGNOSIS — B91 Sequelae of poliomyelitis: Secondary | ICD-10-CM | POA: Insufficient documentation

## 2014-05-25 DIAGNOSIS — M25539 Pain in unspecified wrist: Secondary | ICD-10-CM | POA: Insufficient documentation

## 2014-05-25 DIAGNOSIS — M25519 Pain in unspecified shoulder: Secondary | ICD-10-CM | POA: Insufficient documentation

## 2014-05-25 DIAGNOSIS — M25559 Pain in unspecified hip: Secondary | ICD-10-CM | POA: Diagnosis not present

## 2014-05-25 DIAGNOSIS — M47817 Spondylosis without myelopathy or radiculopathy, lumbosacral region: Secondary | ICD-10-CM | POA: Diagnosis not present

## 2014-05-25 DIAGNOSIS — M549 Dorsalgia, unspecified: Secondary | ICD-10-CM | POA: Diagnosis present

## 2014-05-25 DIAGNOSIS — M654 Radial styloid tenosynovitis [de Quervain]: Secondary | ICD-10-CM

## 2014-05-25 NOTE — Patient Instructions (Signed)
Enfermedad de Engineer, maintenance (De Quervain's Disease) La enfermedad de Lollie Sails es un trastorno que generalmente se observa en los jugadores de deportes con raqueta, quienes presentan una inflamacin (irritacin) de los tendones (estructuras similares a cuerdas que mantienen unido el msculo al [employer]) en la [place], sobre el lado en que se Diplomatic Services operational officer. Puede haber una retraccin de los tejidos que rodean los tendones. Este trastorno generalmente mejora al abandonar o modificar la actividad que lo ocasion. Cuando el tratamiento conservador no [place], puede requerir [place]. El tratamiento conservador puede necesitar cambios en la actividad que ocasion o que empeor el problema. Pueden utilizarse medicamentos antiinflamatorios e inyecciones de corticoides para disminuir la inflamacin y ayudar a Human resources officer. El profesional que lo asiste ayudar a Chief Strategy Officer qu es lo mejor para usted. DIAGNSTICO Generalmente el diagnstico (determinar cul es el problema) se realiza clnicamente a travs del examen fsico. Algunas veces es necesario tomar radiografas. INSTRUCCIONES PARA EL CUIDADO DOMICILIARIO  Aplique hielo sobre el rea dolorida durante 15 a 20 minutos 3 a 4 veces por da mientras se encuentre despierto. Ponga el hielo en una bolsa plstica y coloque una toalla entre la bolsa y la piel. Esto es especialmente beneficioso si puede hacerlo despus de Education officer, environmental todas las actividades que involucren a la [place].  Puede ser beneficioso el entablillado temporario.  Utilice los medicamentos de venta libre o de prescripcin para Chief Technology Officer, Environmental health practitioner o la [place], segn se lo indique el profesional que lo asiste. SOLICITE ATENCIN MDICA SI:  El dolor no se alivia con los medicamentos, o si [place] y Advertising account executive ms que mejorar. EST SEGURO QUE:   Comprende las instrucciones para el alta mdica.  Controlar su enfermedad.  Solicitar atencin mdica de inmediato segn las  indicaciones. Document Released: 09/10/2005 Document Revised: 12/03/2011 [facility] Patient Information 2015 [place], [place]. This information is not intended to replace advice given to you by your health care provider. Make sure you discuss any questions you have with your health care provider.

## 2014-05-25 NOTE — Progress Notes (Signed)
APL,EPB tendon sheath injection R Wrist  Indication De Quervains tenosynovitis unresponsive to conservative care  Informed consent was obtained after describing risks and benefits. Patient elected to proceed  30-gauge 0.5 inch needle used to next skin and subcutaneous tissue with 1 mL lidocaine 1% after Betadine prep  Then under sterile conditions 25-gauge 1.5 inch needle was inserted under direct ultrasound guidance long axis view 12 Hz linear transducer. After tendency to identified needle was introduced and a solution of one ML 1% lidocaine and 0.5 mL 6 mL per cc Celestone injected Patient tolerated procedure well Post procedure instructions given

## 2014-06-02 ENCOUNTER — Encounter: Payer: Self-pay | Admitting: Physical Medicine & Rehabilitation

## 2014-07-20 ENCOUNTER — Ambulatory Visit: Payer: BC Managed Care – HMO | Admitting: Physical Medicine & Rehabilitation

## 2014-07-26 ENCOUNTER — Telehealth: Payer: Self-pay | Admitting: *Deleted

## 2014-07-26 MED ORDER — TRAMADOL HCL 50 MG PO TABS: 50.0000 mg | ORAL_TABLET | Freq: Three times a day (TID) | ORAL | Status: DC

## 2014-07-26 NOTE — Telephone Encounter (Signed)
Notified Mrs Jean RosenthalJackson that her prescription for tramadol had been called to pharmacy.  She will see us next week for appt with Dr Wynn BankerKirsteins.

## 2014-08-03 ENCOUNTER — Ambulatory Visit (HOSPITAL_BASED_OUTPATIENT_CLINIC_OR_DEPARTMENT_OTHER): Payer: BC Managed Care – HMO | Admitting: Physical Medicine & Rehabilitation

## 2014-08-03 ENCOUNTER — Encounter: Payer: Self-pay | Admitting: Physical Medicine & Rehabilitation

## 2014-08-03 ENCOUNTER — Encounter: Payer: BC Managed Care – HMO | Attending: Physical Medicine & Rehabilitation

## 2014-08-03 VITALS — BP 133/67 | HR 73 | Resp 14 | Wt 107.0 lb

## 2014-08-03 DIAGNOSIS — M654 Radial styloid tenosynovitis [de Quervain]: Secondary | ICD-10-CM | POA: Insufficient documentation

## 2014-08-03 DIAGNOSIS — M47817 Spondylosis without myelopathy or radiculopathy, lumbosacral region: Secondary | ICD-10-CM

## 2014-08-03 DIAGNOSIS — M6281 Muscle weakness (generalized): Secondary | ICD-10-CM

## 2014-08-03 DIAGNOSIS — B91 Sequelae of poliomyelitis: Secondary | ICD-10-CM | POA: Diagnosis not present

## 2014-08-03 DIAGNOSIS — M7061 Trochanteric bursitis, right hip: Secondary | ICD-10-CM | POA: Diagnosis present

## 2014-08-03 NOTE — Patient Instructions (Signed)
Next visit will be for wrist injection  No need for right hip injection yet  May qualify for a new right ankle-foot orthosis, please let me know if you need in order

## 2014-08-03 NOTE — Progress Notes (Signed)
Subjective:    Patient ID: Carrie Travis, female    DOB: [birth year], 63 y.o.   MRN: [id number] She has a history of post-polio syndrome, diagnosed in 1995, followed in    [place] for a number of years but only intermittently.  Her polio    onset is back in 1951, right lower limb.  She has been using an AFO to    ambulate for a number of years, i.e., about 15.   Seen in my physical medicine clinic starting in 2009 and last visit was in 2012. Diagnosed with lumbar    spondylosis responsive to medial branch blocks bilateral L5 dorsal    ramus, bilateral L4 medial branch, and bilateral L3 medial branch, last    performed 10/30/2010.  She has had a long absence from clinic.  She states that money has been an issue and is now working 2 jobs She feels like her back pain is back.  The last set of    injections helped a couple of months at least.  She also has additional    complaints of right greater than left hip pain which is exacerbated when    she tries to sleep on her side.  She also has some pain with ambulation.    In addition, she has right greater than left groin pain.  She has pain    in the anterior shin on the right side.  She has had to pad her orthosis    with some foam, has lost some weight, and has poor fit.  She is also    complaining of some decreased balance as well.  She has pain that    interferes activity at a moderate level.  Her average pain is 8, worst    pain is in the low back.  Her pain is worsened with prolonged sitting,    improves with medications and injections.  Relief from meds is fair.    Her previous pain meds included tramadol 50 mg t.i.d. she has no longer been taking this. She takes over-the-counter analgesics such as ibuprofen but her acid reflux has been flaring up again. I discussed that this may be the cause.  HPI Back ok Sitting tolerance limited Right wrist better after DeQuervain injection  Pain Inventory Average Pain 8 Pain Right Now 8 My  pain is burning  In the last 24 hours, has pain interfered with the following? General activity 7 Relation with others 8 Enjoyment of life 7 What TIME of day is your pain at its worst? daytime evening and night Sleep (in general) Good  Pain is worse with: walking Pain improves with: medication and injections Relief from Meds: na  Mobility walk without assistance how many minutes can you walk? 5 miles do you drive?  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  no  Physicians involved in your care Any changes since last visit?  no   History reviewed. No pertinent family history. History   Social History  . Marital Status: Single    Spouse Name: N/A    Number of Children: N/A  . Years of Education: N/A   Social History Main Topics  . Smoking status: Never Smoker   . Smokeless tobacco: Never Used  . Alcohol Use: None  . Drug Use: None  . Sexual Activity: None   Other Topics Concern  . None  Social History Narrative   History reviewed. No pertinent past surgical history. History reviewed. No pertinent past medical history. BP 133/67 mmHg  Pulse 73  Resp 14  Wt 107 lb (48.535 kg)  SpO2 99%  Opioid Risk Score:   Fall Risk Score:  (previoulsy educated and given handout) Review of Systems  Constitutional: Positive for diaphoresis.  Genitourinary:       Bladder control  Musculoskeletal: Positive for gait problem.  All other systems reviewed and are negative.      Objective:   Physical Exam  Constitutional: She is oriented to person, place, and time. She appears well-developed and well-nourished.  Neurological: She is alert and oriented to person, place, and time. She has normal reflexes.  Psychiatric: She has a normal mood and affect.  Nursing note and vitals reviewed.  Right wrist positive Finkelstein's test No swelling  in the right wrist joint. Positive tenderness over right greater trochanter of the hip Severe atrophy of right pretibial and posterior tibial musculature no evidence of fasciculation. Motor strength is trace ankle dorsiflexion on the right absent plantar flexion knee extension is 3 hip flexion is 5 on the right 5/5 on the left side throughout the lower extremity. Ambulates with a right AFO no evidence of toe drag        Assessment & Plan:  1. Lumbar spondylosis with increasing pain, continue tramadol 50 g 3 times a day, improved after lumbar medial branch blocks. No need for reinjection at the current time  2. Right hip trochanteric bursitis, overall improved no need for reinjection 3. Post polio syndrome  Continues to have atrophy of the right calf and anterior tibial muscle. May need orthotic revised, Continues to function at an independent level  #4. DeQuervain's tenosynovitis: Patient has had symptoms recurring for 3 wks. 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 If not better at next visit in 4 weeks will do injection under ultrasound guidance

## 2014-09-02 ENCOUNTER — Ambulatory Visit: Payer: BC Managed Care – HMO | Admitting: Physical Medicine & Rehabilitation

## 2014-09-02 ENCOUNTER — Encounter: Payer: BC Managed Care – HMO | Attending: Physical Medicine & Rehabilitation

## 2014-09-02 DIAGNOSIS — M47817 Spondylosis without myelopathy or radiculopathy, lumbosacral region: Secondary | ICD-10-CM | POA: Insufficient documentation

## 2014-09-02 DIAGNOSIS — B91 Sequelae of poliomyelitis: Secondary | ICD-10-CM | POA: Insufficient documentation

## 2014-09-02 DIAGNOSIS — M7061 Trochanteric bursitis, right hip: Secondary | ICD-10-CM | POA: Insufficient documentation

## 2014-09-02 DIAGNOSIS — M654 Radial styloid tenosynovitis [de Quervain]: Secondary | ICD-10-CM | POA: Insufficient documentation

## 2014-09-03 ENCOUNTER — Ambulatory Visit (HOSPITAL_BASED_OUTPATIENT_CLINIC_OR_DEPARTMENT_OTHER): Payer: BC Managed Care – HMO | Admitting: Physical Medicine & Rehabilitation

## 2014-09-03 ENCOUNTER — Encounter: Payer: Self-pay | Admitting: Physical Medicine & Rehabilitation

## 2014-09-03 VITALS — BP 154/90 | HR 84 | Resp 14 | Ht <= 58 in | Wt 109.0 lb

## 2014-09-03 DIAGNOSIS — M654 Radial styloid tenosynovitis [de Quervain]: Secondary | ICD-10-CM

## 2014-09-03 DIAGNOSIS — B91 Sequelae of poliomyelitis: Secondary | ICD-10-CM | POA: Diagnosis not present

## 2014-09-03 DIAGNOSIS — M7061 Trochanteric bursitis, right hip: Secondary | ICD-10-CM | POA: Diagnosis present

## 2014-09-03 DIAGNOSIS — M47817 Spondylosis without myelopathy or radiculopathy, lumbosacral region: Secondary | ICD-10-CM | POA: Diagnosis not present

## 2014-09-03 NOTE — Progress Notes (Signed)
APL,EPB tendon sheath injection R Wrist  Indication De Quervains tenosynovitis unresponsive to conservative care  Informed consent was obtained after describing risks and benefits. Patient elected to proceed  27-gauge 0.5 inch needle used to next skin and subcutaneous tissue with 1 mL lidocaine 1% after Betadine prep  Then under sterile conditions 27-gauge 0.5 inch needle was inserted under direct ultrasound guidance long axis view 12 Hz linear transducer. After tendency to identified needle was introduced and a solution of one ML 1% lidocaine and 0.5 mL 6 mL per cc Celestone injected Patient tolerated procedure well Post procedure instructions given

## 2014-09-03 NOTE — Patient Instructions (Signed)
You had an ultrasound-guided right wrist injection. Injected cortisone and lidocaine It may take 1-2 days to reach full effect May repeat in 3 months as needed

## 2014-11-20 ENCOUNTER — Other Ambulatory Visit: Payer: Self-pay | Admitting: Physical Medicine & Rehabilitation

## 2014-12-03 ENCOUNTER — Ambulatory Visit: Payer: BC Managed Care – HMO | Admitting: Physical Medicine & Rehabilitation

## 2014-12-14 ENCOUNTER — Ambulatory Visit: Payer: Self-pay

## 2014-12-14 ENCOUNTER — Ambulatory Visit: Payer: Self-pay | Admitting: Physical Medicine & Rehabilitation

## 2015-01-11 ENCOUNTER — Ambulatory Visit: Payer: Self-pay | Admitting: Physical Medicine & Rehabilitation

## 2015-01-18 ENCOUNTER — Ambulatory Visit: Payer: Self-pay | Admitting: Physical Medicine & Rehabilitation

## 2015-02-08 ENCOUNTER — Other Ambulatory Visit: Payer: Self-pay | Admitting: *Deleted

## 2015-02-08 MED ORDER — TRAMADOL HCL 50 MG PO TABS: 50.0000 mg | ORAL_TABLET | Freq: Three times a day (TID) | ORAL | Status: DC

## 2015-02-08 NOTE — Telephone Encounter (Signed)
Pt called, says she was going to run out of her meds prior to her 03/14/2015 appt with Dr. Wynn BankerKirsteins, determined the medication was tramadol, consulted Sybil, rx was phoned in to patients preferred pharmacy

## 2015-02-19 ENCOUNTER — Other Ambulatory Visit: Payer: Self-pay | Admitting: Physical Medicine & Rehabilitation

## 2015-03-14 ENCOUNTER — Encounter: Payer: Self-pay | Admitting: Physical Medicine & Rehabilitation

## 2015-03-14 ENCOUNTER — Ambulatory Visit (HOSPITAL_BASED_OUTPATIENT_CLINIC_OR_DEPARTMENT_OTHER): Payer: BC Managed Care – PPO | Admitting: Physical Medicine & Rehabilitation

## 2015-03-14 ENCOUNTER — Encounter: Payer: BC Managed Care – PPO | Attending: Physical Medicine & Rehabilitation

## 2015-03-14 VITALS — BP 161/84 | HR 71 | Resp 14

## 2015-03-14 DIAGNOSIS — M7061 Trochanteric bursitis, right hip: Secondary | ICD-10-CM | POA: Diagnosis not present

## 2015-03-14 DIAGNOSIS — M654 Radial styloid tenosynovitis [de Quervain]: Secondary | ICD-10-CM | POA: Insufficient documentation

## 2015-03-14 DIAGNOSIS — M47817 Spondylosis without myelopathy or radiculopathy, lumbosacral region: Secondary | ICD-10-CM | POA: Diagnosis not present

## 2015-03-14 MED ORDER — TRAMADOL HCL 50 MG PO TABS: 50.0000 mg | ORAL_TABLET | Freq: Three times a day (TID) | ORAL | Status: DC | PRN

## 2015-03-14 NOTE — Progress Notes (Signed)
Subjective:    Patient ID: Carrie Travis, female    DOB: 1951-04-30, 64 y.o.   MRN: [id number] She has a history of post-polio syndrome, diagnosed in 1995, followed in    [place] for a number of years but only intermittently.  Her polio    onset is back in 1951, right lower limb.  She has been using an AFO to    ambulate for a number of years, i.e., about 15.   Seen in my physical medicine clinic starting in 2009 and last visit was in 2012. Diagnosed with lumbar    spondylosis responsive to medial branch blocks bilateral L5 dorsal    ramus, bilateral L4 medial branch, and bilateral L3 medial branch, last    performed 10/30/2010.  She has had a long absence from clinic.  She states that money has been an issue and is now working 2 jobs She feels like her back pain is back.  The last set of    injections helped a couple of months at least. 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 General activity 2 Relation with others 2 Enjoyment of life 2 What TIME of day is your pain at its worst? all day Sleep (in general) Poor  Pain is worse with: standing and some activites Pain improves with: medication and injections Relief from Meds: 6  Mobility walk without assistance ability to climb steps?  no do you drive?  yes Do you have any goals in this area?  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  no  Physicians involved in your care Any changes since last visit?  no   History reviewed. No pertinent family history. History   Social History  . Marital Status: Single    Spouse Name: N/A  . Number of Children: N/A  . Years of Education:  N/A   Social History Main Topics  . Smoking status: Never Smoker   . Smokeless tobacco: Never Used  . Alcohol Use: Not on file  . Drug Use: Not on file  . Sexual Activity: Not on file   Other Topics Concern  . None   Social History Narrative   History reviewed. No pertinent past surgical history. History reviewed. No pertinent past medical history. BP 161/84 mmHg  Pulse 71  Resp 14  SpO2 99%  Opioid Risk Score:   Fall Risk Score:  `1  Depression screen PHQ 2/9  No flowsheet data found.   Review of Systems  Constitutional:       Night sweats  Neurological: Positive for weakness.  Psychiatric/Behavioral: The patient is nervous/anxious.   All other systems reviewed and are negative.      Objective:   Physical Exam  Patient has pelvic obliquity with left iliac crest approximately 1-2 cm higher than the right. She can correct this by tilting her pelvis and allowing the right foot to cover about 1 cm above the ground She has tenderness palpation over the right greater trochanter but no pain with hip range of motion. She does have wasting of the right TA and the right calf No tenderness palpation over the right wrist There  is tenderness palpation and with range of motion of the left thumb IP joint. There is no evidence of swelling or joint deformity however     Assessment & Plan:  1. Lumbar spondylosis with increasing pain, continue tramadol 50 g 3 times a day, improved after lumbar medial branch blocks. No need for reinjection at the current time Some functional leg length discrepency, trial R heel lift  2. Right hip trochanteric bursitis, overall improved no need for reinjection 3. Post polio syndrome  Continues to have atrophy of the right calf and anterior tibial muscle. May need orthotic revised, Continues to function at an independent level  #4. DeQuervain's tenosynovitis: Patient has had symptoms recurring for 3 wks. 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 If not better at next visit in 4 weeks will do injection under ultrasound guidance  5. Osteoarthritis left thumb IP joint, we discussed joint preservation techniques which The patient already incorporates like large handled kitchen instruments.Consider Voltaren gel if it becomes more painful

## 2015-03-14 NOTE — Patient Instructions (Signed)
Call if you need repeat repeat hip or wrist injection

## 2015-06-12 ENCOUNTER — Other Ambulatory Visit: Payer: Self-pay | Admitting: Physical Medicine & Rehabilitation

## 2015-09-09 ENCOUNTER — Encounter: Payer: BC Managed Care – PPO | Attending: Physical Medicine & Rehabilitation

## 2015-09-09 ENCOUNTER — Encounter: Payer: Self-pay | Admitting: Physical Medicine & Rehabilitation

## 2015-09-09 ENCOUNTER — Ambulatory Visit (HOSPITAL_BASED_OUTPATIENT_CLINIC_OR_DEPARTMENT_OTHER): Payer: BC Managed Care – PPO | Admitting: Physical Medicine & Rehabilitation

## 2015-09-09 VITALS — BP 135/93 | HR 86 | Resp 14

## 2015-09-09 DIAGNOSIS — R200 Anesthesia of skin: Secondary | ICD-10-CM | POA: Diagnosis not present

## 2015-09-09 DIAGNOSIS — B91 Sequelae of poliomyelitis: Secondary | ICD-10-CM | POA: Insufficient documentation

## 2015-09-09 DIAGNOSIS — M47817 Spondylosis without myelopathy or radiculopathy, lumbosacral region: Secondary | ICD-10-CM | POA: Diagnosis not present

## 2015-09-09 DIAGNOSIS — M6281 Muscle weakness (generalized): Principal | ICD-10-CM

## 2015-09-09 DIAGNOSIS — M25511 Pain in right shoulder: Secondary | ICD-10-CM | POA: Insufficient documentation

## 2015-09-09 DIAGNOSIS — F419 Anxiety disorder, unspecified: Secondary | ICD-10-CM | POA: Insufficient documentation

## 2015-09-09 DIAGNOSIS — M25512 Pain in left shoulder: Secondary | ICD-10-CM | POA: Diagnosis not present

## 2015-09-09 DIAGNOSIS — Z79899 Other long term (current) drug therapy: Secondary | ICD-10-CM | POA: Diagnosis not present

## 2015-09-09 MED ORDER — DICLOFENAC SODIUM 1 % TD GEL: 2.0000 g | Freq: Four times a day (QID) | TRANSDERMAL | Status: DC

## 2015-09-09 MED ORDER — TRAMADOL HCL 50 MG PO TABS: 50.0000 mg | ORAL_TABLET | Freq: Three times a day (TID) | ORAL | Status: DC | PRN

## 2015-09-09 NOTE — Progress Notes (Signed)
Subjective:    Patient ID: Carrie Travis, female    DOB: 06/18/1951, 64 y.o.   MRN: [id number]  HPI Weight loss unintentional  Working a lot in [employer] Had stomach virus in summer but no further nausea vomiting diarrhea or constipation  Sees OB/GYN as a PCP We discussed that the postpolio syndrome could cause a small amount of weight loss with muscle atrophy but she needs to see her PCP for a full physical to address this issue.  No increase in low back pain. She does have some numbness along the outside of the left leg.  Has bilateral shoulder pain mainly when she lays on her side when trying to sleep. Not so much pain with overhead activities. No wrist pain No longer works at the [employer]   Pain Inventory Average Pain 4 Pain Right Now 3 My pain is burning  In the last 24 hours, has pain interfered with the following? General activity 7 Relation with others 8 Enjoyment of life 7 What TIME of day is your pain at its worst? all Sleep (in general) Poor  Pain is worse with: some activites Pain improves with: medication Relief from Meds: 7  Mobility walk without assistance how many minutes can you walk? unlimited ability to climb steps?  no do you drive?  no Do you have any goals in this area?  no  Function employed # of hrs/week .  Neuro/Psych numbness tingling anxiety  Prior Studies Any changes since last visit?  no  Physicians involved in your care Any changes since last visit?  no   History reviewed. No pertinent family history. Social History   Social History  . Marital Status: Single    Spouse Name: N/A  . Number of Children: N/A  . Years of Education: N/A   Social History Main Topics  . Smoking status: Never Smoker   . Smokeless tobacco: Never Used  . Alcohol Use: None  . Drug Use: None  . Sexual Activity: Not Asked   Other Topics Concern  . None   Social History Narrative   History reviewed. No pertinent past surgical history. History  reviewed. No pertinent past medical history. BP 135/93 mmHg  Pulse 86  Resp 14  SpO2 100%  Opioid Risk Score:   Fall Risk Score:  `1  Depression screen PHQ 2/9  Depression screen PHQ 2/9 09/09/2015  Decreased Interest 2  Down, Depressed, Hopeless 1  PHQ - 2 Score 3  Altered sleeping 3  Tired, decreased energy 3  Change in appetite 3  Feeling bad or failure about yourself  1  Trouble concentrating 1  Moving slowly or fidgety/restless 1  Suicidal thoughts 0  PHQ-9 Score 15  Difficult doing work/chores Somewhat difficult     Review of Systems  Constitutional: Positive for diaphoresis and unexpected weight change.  Respiratory: Positive for apnea and cough.   Gastrointestinal: Positive for diarrhea.  Neurological: Positive for numbness.       Tingling   Psychiatric/Behavioral: The patient is nervous/anxious.   All other systems reviewed and are negative.      Objective:   Physical Exam  Constitutional: She is oriented to person, place, and time. She appears well-developed and well-nourished.  HENT:  Head: Normocephalic and atraumatic.  Eyes: Conjunctivae and EOM are normal. Pupils are equal, round, and reactive to light.  Neck: Normal range of motion.  Musculoskeletal:       Right shoulder: She exhibits decreased range of motion and pain.  Left shoulder: She exhibits decreased range of motion and pain.       Right wrist: Normal.       Left wrist: Normal.       Left knee: She exhibits normal range of motion, no swelling and no effusion. No tenderness found.       Lumbar back: She exhibits normal range of motion.  Neurological: She is alert and oriented to person, place, and time.  Mildly decreased sensation to pinprick left lateral leg normal sensation left medial leg normal sensation in the foot  Psychiatric: She has a normal mood and affect.  Nursing note and vitals reviewed.  Wasting of the right lower extremity musculature thigh and calf, no sensory  deficits  Deep tendon reflexes 3+ left patellar and ankle 2+ right patellar and ankle Negative straight leg raise  No tenderness to palpation over the hip area     Assessment & Plan:  1. History lumbar spondylosis she is doing well from this standpoint. She does have some left lateral leg numbness which I suspect may be a left L4 radiculitis. She likely has some progression of her spondylosis. At this point this is fairly mild and intermittent. We'll give her some exercises to perform. No additional imaging studies needed at the current time.  2. 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 De Quervain's tenosynovitis resolved  4. Trochanteric bursitis resolved  5. Bilateral shoulder pains. acromioclavicular joint, Educated patient on avoiding side-lying.  6. Unintentional weight loss follow-up with primary care physician  Continue tramadol 50 g 3 times a day Voltaren gel to acromioclavicular joints  Return to clinic 6 months

## 2015-09-09 NOTE — Patient Instructions (Addendum)

## 2015-09-14 ENCOUNTER — Telehealth: Payer: Self-pay | Admitting: *Deleted

## 2015-09-14 NOTE — Telephone Encounter (Signed)
Voltaren Gel 1% prior auth approved 08/15/15-09/13/16

## 2015-10-06 ENCOUNTER — Other Ambulatory Visit: Payer: Self-pay | Admitting: Physical Medicine & Rehabilitation

## 2016-01-20 ENCOUNTER — Telehealth: Payer: Self-pay

## 2016-01-20 DIAGNOSIS — M21371 Foot drop, right foot: Secondary | ICD-10-CM

## 2016-01-20 NOTE — Telephone Encounter (Signed)
Pt called stating that her right leg brace is broken. She needs an rx for a new brace. Please advise.

## 2016-01-23 ENCOUNTER — Telehealth: Payer: Self-pay | Admitting: Physical Medicine & Rehabilitation

## 2016-01-23 DIAGNOSIS — M21371 Foot drop, right foot: Secondary | ICD-10-CM

## 2016-01-23 NOTE — Telephone Encounter (Signed)
Patients brace is broken and would like a new prescription for a new one.  Please call patient at [phone] if this can be done.

## 2016-01-24 NOTE — Telephone Encounter (Signed)
Order faxed to [facility]. Judithe notified

## 2016-01-24 NOTE — Addendum Note (Signed)
Addended by: Erick ColaceKIRSTEINS, ANDREW E on: 01/24/2016 11:44 AM   Modules accepted: Orders

## 2016-03-09 ENCOUNTER — Ambulatory Visit: Payer: BC Managed Care – PPO | Admitting: Physical Medicine & Rehabilitation

## 2016-03-09 ENCOUNTER — Other Ambulatory Visit: Payer: Self-pay | Admitting: Physical Medicine & Rehabilitation

## 2016-03-09 NOTE — Telephone Encounter (Signed)
Patient is requesting a refill on Tramadol

## 2016-03-12 ENCOUNTER — Ambulatory Visit (HOSPITAL_BASED_OUTPATIENT_CLINIC_OR_DEPARTMENT_OTHER): Payer: BC Managed Care – PPO | Admitting: Physical Medicine & Rehabilitation

## 2016-03-12 ENCOUNTER — Encounter: Payer: BC Managed Care – PPO | Attending: Physical Medicine & Rehabilitation

## 2016-03-12 ENCOUNTER — Encounter: Payer: Self-pay | Admitting: Physical Medicine & Rehabilitation

## 2016-03-12 VITALS — BP 150/74 | HR 80 | Resp 14

## 2016-03-12 DIAGNOSIS — M47816 Spondylosis without myelopathy or radiculopathy, lumbar region: Secondary | ICD-10-CM

## 2016-03-12 DIAGNOSIS — M79671 Pain in right foot: Secondary | ICD-10-CM

## 2016-03-12 DIAGNOSIS — M6281 Muscle weakness (generalized): Secondary | ICD-10-CM | POA: Insufficient documentation

## 2016-03-12 DIAGNOSIS — M545 Low back pain: Secondary | ICD-10-CM | POA: Diagnosis present

## 2016-03-12 MED ORDER — TRAMADOL HCL 50 MG PO TABS: 50.0000 mg | ORAL_TABLET | Freq: Three times a day (TID) | ORAL | Status: DC | PRN

## 2016-03-12 MED ORDER — DICLOFENAC SODIUM 1 % TD GEL: 2.0000 g | Freq: Four times a day (QID) | TRANSDERMAL | Status: DC

## 2016-03-12 NOTE — Progress Notes (Signed)
Subjective:    Patient ID: Carrie Travis, female    DOB: 11-06-1950, 65 y.o.   MRN: [id number]  HPI  Patient last seen approximate 6 months ago. She is complaining of increasing low back pain which is relieved by bending forward. 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 In addition patient complains of increasing right hip pain. She's had this in the past but has not been present for greater than 6 months. In the past she has undergone greater trochanteric injection with good success.  Received a new AFO. She does have some right mid foot pain which is mainly on the medial aspect. She denies any trauma to the area no prior issues there no history of surgery to that area. Pain Inventory Average Pain 7 Pain Right Now 7 My pain is sharp, tingling and aching  In the last 24 hours, has pain interfered with the following? General activity 3 Relation with others 3 Enjoyment of life 5 What TIME of day is your pain at its worst? all Sleep (in general) NA  Pain is worse with: walking, sitting, standing and some activites Pain improves with: na Relief from Meds: 8  Mobility walk without assistance ability to climb steps?  no do you drive?  yes  Function employed # of hrs/week 35.5  Neuro/Psych weakness numbness tingling  Prior Studies Any changes since last visit?  no  Physicians involved in your care Any changes since last visit?  no   History reviewed. No pertinent family history. Social History   Social History  . Marital Status: Single    Spouse Name: N/A  . Number of Children: N/A  . Years of Education: N/A   Social History Main Topics  . Smoking status: Never Smoker   . Smokeless tobacco: Never Used  . Alcohol Use: None  . Drug Use: None  . Sexual Activity: Not Asked   Other Topics Concern  . None   Social History Narrative   History reviewed. No pertinent past surgical history. History reviewed. No pertinent past medical history. BP 150/74 mmHg   Pulse 80  Resp 14  SpO2 98%  Opioid Risk Score:   Fall Risk Score:  `1  Depression screen PHQ 2/9  Depression screen [facility] 2/9 03/12/2016 09/09/2015  Decreased Interest 0 2  Down, Depressed, Hopeless 0 1  PHQ - 2 Score 0 3  Altered sleeping - 3  Tired, decreased energy - 3  Change in appetite - 3  Feeling bad or failure about yourself  - 1  Trouble concentrating - 1  Moving slowly or fidgety/restless - 1  Suicidal thoughts - 0  PHQ-9 Score - 15  Difficult doing work/chores - Somewhat difficult       Review of Systems  Respiratory: Positive for apnea.   All other systems reviewed and are negative.      Objective:   Physical Exam  Constitutional: She is oriented to person, place, and time. She appears well-developed and well-nourished.  HENT:  Head: Normocephalic and atraumatic.  Eyes: Conjunctivae and EOM are normal. Pupils are equal, round, and reactive to light.  Neck: Normal range of motion.  Neurological: She is alert and oriented to person, place, and time.  Psychiatric: She has a normal mood and affect.  Nursing note and vitals reviewed.   Tenderness palpation over the right greater trochanter of the hip She has tenderness palpation over the navicular area medially in the midfoot on the right side. No evidence of skin breakdown. No  evidence of erythema or swelling. 2 minus right toe extensor strength 0 at the plantar flexors, 0 at the right foot inverters and everters, trace at the ankle dorsiflexors.  Lumbar flexion is full range. Lumbar extension is 50% range with some endrange discomfort also some pain with lateral bending to the left as well as to the right     Assessment & Plan:  1. History lumbar spondylosis she is doing well from this standpoint. . She likely has some progression of her spondylosis. At this point this is fairly mild and intermittent. We'll give her some exercises to perform. No additional imaging studies needed at the current time. Will  set up L3-4-5 Bilateral, last injection 08/2013  2. 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 Trochanteric bursitis  Right recurrent , trouble sleeping on that side  Trochanteric bursa injection With or without ultrasound guidance  Indication Trochanteric bursitis. Exam has tenderness over the greater trochanter of the hip. Pain has not responded to conservative care such as exercise therapy and oral medications. Pain interferes with sleep or with mobility, Good response to prior injection approximately 2 years ago Informed consent was obtained after describing risks and benefits of the procedure with the patient these include bleeding bruising and infection. Patient has signed written consent form. Patient placed in a lateral decubitus position with the affected hip superior. Point of maximal pain was palpated marked and prepped with Betadine and entered with a needle to bone contact. Needle slightly withdrawn then 6mg  of betamethasone with 4 cc 1% lidocaine were injected. Patient tolerated procedure well. Post procedure instructions given.   4. Bilateral shoulder pains. acromioclavicular joint, Educated patient on avoiding side-lying.  5.  Right midfoot pain, Suspect arthritic changes, we'll check x-rays. New brace may have altered some of her her biomechanics.  Continue tramadol 50 g 3 times a day Voltaren gel right foot

## 2016-03-12 NOTE — Patient Instructions (Signed)
Next visit will do back injections  We can review foot x-rays at that time

## 2016-04-09 ENCOUNTER — Ambulatory Visit: Payer: BC Managed Care – PPO | Admitting: Physical Medicine & Rehabilitation

## 2016-04-09 ENCOUNTER — Ambulatory Visit: Payer: BC Managed Care – PPO

## 2016-07-09 ENCOUNTER — Ambulatory Visit: Payer: BC Managed Care – PPO | Admitting: Physical Medicine & Rehabilitation

## 2016-07-17 ENCOUNTER — Encounter: Payer: Self-pay | Admitting: Physical Medicine & Rehabilitation

## 2016-07-17 ENCOUNTER — Encounter: Payer: BC Managed Care – PPO | Attending: Physical Medicine & Rehabilitation

## 2016-07-17 ENCOUNTER — Ambulatory Visit (HOSPITAL_BASED_OUTPATIENT_CLINIC_OR_DEPARTMENT_OTHER): Payer: BC Managed Care – PPO | Admitting: Physical Medicine & Rehabilitation

## 2016-07-17 VITALS — BP 129/83 | HR 75 | Resp 14

## 2016-07-17 DIAGNOSIS — M7061 Trochanteric bursitis, right hip: Secondary | ICD-10-CM | POA: Diagnosis not present

## 2016-07-17 DIAGNOSIS — M654 Radial styloid tenosynovitis [de Quervain]: Secondary | ICD-10-CM | POA: Insufficient documentation

## 2016-07-17 DIAGNOSIS — M6281 Muscle weakness (generalized): Secondary | ICD-10-CM

## 2016-07-17 DIAGNOSIS — M47816 Spondylosis without myelopathy or radiculopathy, lumbar region: Secondary | ICD-10-CM

## 2016-07-17 DIAGNOSIS — B91 Sequelae of poliomyelitis: Secondary | ICD-10-CM

## 2016-07-17 DIAGNOSIS — M47817 Spondylosis without myelopathy or radiculopathy, lumbosacral region: Secondary | ICD-10-CM | POA: Insufficient documentation

## 2016-07-17 MED ORDER — TRAMADOL HCL 50 MG PO TABS: 50.0000 mg | ORAL_TABLET | Freq: Three times a day (TID) | ORAL | 5 refills | Status: DC | PRN
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
## 2016-07-17 NOTE — Patient Instructions (Signed)
Aspercreme may be a good alternative to Voltaren  Non medication pain relief  Try ice alternating with heating pad for 20 minutes every 2 hours as needed  Try TENs unit that can be puchased in a pharmacy for about 40.00, brands include Aleve or Icy Hot  Try various muscle cremes  Aspercreme or others that contain trolamine- this is anti inflammatory  Capsaicin creme which reduces Pain substance P  Lidocaine containing creme which has a numbing medicine  Methol and camphor containing creme which has a cooling effect

## 2016-08-14 ENCOUNTER — Encounter: Payer: BC Managed Care – PPO | Attending: Physical Medicine & Rehabilitation

## 2016-08-14 ENCOUNTER — Ambulatory Visit (HOSPITAL_BASED_OUTPATIENT_CLINIC_OR_DEPARTMENT_OTHER): Payer: BC Managed Care – PPO | Admitting: Physical Medicine & Rehabilitation

## 2016-08-14 DIAGNOSIS — M7061 Trochanteric bursitis, right hip: Secondary | ICD-10-CM | POA: Insufficient documentation

## 2016-08-14 DIAGNOSIS — B91 Sequelae of poliomyelitis: Secondary | ICD-10-CM | POA: Diagnosis not present

## 2016-08-14 DIAGNOSIS — M654 Radial styloid tenosynovitis [de Quervain]: Secondary | ICD-10-CM | POA: Insufficient documentation

## 2016-08-14 DIAGNOSIS — M6281 Muscle weakness (generalized): Secondary | ICD-10-CM | POA: Insufficient documentation

## 2016-08-14 DIAGNOSIS — M47817 Spondylosis without myelopathy or radiculopathy, lumbosacral region: Secondary | ICD-10-CM | POA: Insufficient documentation

## 2016-08-14 MED ORDER — DICLOFENAC SODIUM 1 % TD GEL: 2.0000 g | Freq: Four times a day (QID) | TRANSDERMAL | 5 refills | Status: DC

## 2016-08-14 NOTE — Progress Notes (Signed)
  PROCEDURE RECORD [facility]   Name: Carrie Travis DOB:1951/06/30 MRN: [id number]  Date:08/14/2016  Physician: Claudette LawsAndrew Kirsteins, MD    Nurse/CMA: Bing PlumeHaynes  Allergies: No Known Allergies  Consent Signed: Yes.    Is patient diabetic? No.  CBG today?  Pregnant: No. LMP: No LMP recorded. Patient is postmenopausal. (age 67-55)  Anticoagulants: no Anti-inflammatory: no Antibiotics: no  Procedure: Medial Branch Block Bilateral Position: Prone Start Time: 0945 End Time: 0956 Fluoro Time: 37  RN/CMA Gillermo MurdochHaynes Leyton Magoon    Time 0910 1000    BP 143/85 159/89    Pulse 80 70    Respirations 14 14    O2 Sat 98 98    S/S 6 6    Pain Level 6 0     D/C home with Carrie Travis , patient A & O X 3, D/C instructions reviewed, and sits independently.

## 2016-08-14 NOTE — Progress Notes (Signed)
Bilateral Lumbar L3, L4  medial branch blocks and L 5 dorsal ramus injection under fluoroscopic guidance  Indication: Lumbar pain which is not relieved by medication management or other conservative care and interfering with self-care and mobility.  Informed consent was obtained after describing risks and benefits of the procedure with the patient, this includes bleeding, infection, paralysis and medication side effects.  The patient wishes to proceed and has given written consent.  The patient was placed in prone position.  The lumbar area was marked and prepped with Betadine.  One mL of 1% lidocaine was injected into each of 6 areas into the skin and subcutaneous tissue.  Then a 22-gauge 3.5 in spinal needle was inserted targeting the junction of the left S1 superior articular process and sacral ala junction. Needle was advanced under fluoroscopic guidance.  Bone contact was made.  Omnipaque 180 was injected x 0.5 mL demonstrating no intravascular uptake.  Then a solution containing one mL of 4 mg per mL dexamethasone and 3 mL of 2% MPF lidocaine was injected x 0.5 mL.  Then the left L5 superior articular process in transverse process junction was targeted.  Bone contact was made.  Omnipaque 180 was injected x 0.5 mL demonstrating no intravascular uptake. Then a solution containing one mL of 4 mg per mL dexamethasone and 3 mL of 2% MPF lidocaine was injected x 0.5 mL.  Then the left L4 superior articular process in transverse process junction was targeted.  Bone contact was made.  Omnipaque 180 was injected x 0.5 mL demonstrating no intravascular uptake.  Then a solution containing one mL of 4 mg per mL dexamethasone and 3 mL if 2% MPF lidocaine was injected x 0.5 mL.  This same procedure was performed on the right side using the same needle, technique and injectate.  Patient tolerated procedure well.  Post procedure instructions were given. 

## 2016-08-14 NOTE — Patient Instructions (Signed)

## 2016-10-09 ENCOUNTER — Ambulatory Visit: Payer: BC Managed Care – PPO | Admitting: Physical Medicine & Rehabilitation

## 2017-01-18 ENCOUNTER — Encounter: Payer: Self-pay | Admitting: Physical Medicine & Rehabilitation

## 2017-01-18 ENCOUNTER — Encounter: Payer: BC Managed Care – PPO | Attending: Physical Medicine & Rehabilitation

## 2017-01-18 ENCOUNTER — Ambulatory Visit (HOSPITAL_BASED_OUTPATIENT_CLINIC_OR_DEPARTMENT_OTHER): Payer: BC Managed Care – PPO | Admitting: Physical Medicine & Rehabilitation

## 2017-01-18 VITALS — BP 143/76 | HR 79

## 2017-01-18 DIAGNOSIS — M19011 Primary osteoarthritis, right shoulder: Secondary | ICD-10-CM | POA: Insufficient documentation

## 2017-01-18 DIAGNOSIS — M19012 Primary osteoarthritis, left shoulder: Secondary | ICD-10-CM | POA: Diagnosis not present

## 2017-01-18 DIAGNOSIS — G14 Postpolio syndrome: Secondary | ICD-10-CM | POA: Diagnosis present

## 2017-01-18 DIAGNOSIS — M47817 Spondylosis without myelopathy or radiculopathy, lumbosacral region: Secondary | ICD-10-CM | POA: Diagnosis not present

## 2017-01-18 DIAGNOSIS — M21371 Foot drop, right foot: Secondary | ICD-10-CM | POA: Diagnosis not present

## 2017-01-18 MED ORDER — DICLOFENAC SODIUM 1 % TD GEL: 2.0000 g | Freq: Four times a day (QID) | TRANSDERMAL | 5 refills | Status: DC

## 2017-01-18 MED ORDER — TRAMADOL HCL 50 MG PO TABS: 50.0000 mg | ORAL_TABLET | Freq: Three times a day (TID) | ORAL | 5 refills | Status: DC | PRN

## 2017-01-18 NOTE — Progress Notes (Signed)
Subjective:    Patient ID: Carrie Travis, female    DOB: 1951/07/06, 66 y.o.   MRN: [id number]  HPI Wants appt with post polio clinic, notes increased tripping if R KAFO is removed, has bilateral shoulder pain but not weakness Reviewed Xrays from 2010 bilateral AC and Glenohumeral Joint,  Problems with losing balance more but no falls, no pain issues  Back pain improved after MBB, doesn't feel like pain is increasing again.  Pain Inventory Average Pain 8 Pain Right Now 2 My pain is burning and aching  In the last 24 hours, has pain interfered with the following? General activity 0 Relation with others 0 Enjoyment of life 0 What TIME of day is your pain at its worst? morning Sleep (in general) Good  Pain is worse with: sitting and inactivity Pain improves with: medication and activity Relief from Meds: 5  Mobility walk without assistance ability to climb steps?  no do you drive?  yes  Function employed # of hrs/week 37+  Neuro/Psych No problems in this area  Prior Studies Any changes since last visit?  no  Physicians involved in your care Any changes since last visit?  no   No family history on file. Social History   Social History  . Marital status: Single    Spouse name: N/A  . Number of children: N/A  . Years of education: N/A   Social History Main Topics  . Smoking status: Never Smoker  . Smokeless tobacco: Never Used  . Alcohol use Not on file  . Drug use: Unknown  . Sexual activity: Not on file   Other Topics Concern  . Not on file   Social History Narrative  . No narrative on file   No past surgical history on file. No past medical history on file. There were no vitals taken for this visit.  Opioid Risk Score:   Fall Risk Score:  `1  Depression screen PHQ 2/9  Depression screen [facility] 2/9 03/12/2016 09/09/2015  Decreased Interest 0 2  Down, Depressed, Hopeless 0 1  PHQ - 2 Score 0 3  Altered sleeping - 3  Tired, decreased energy - 3    Change in appetite - 3  Feeling bad or failure about yourself  - 1  Trouble concentrating - 1  Moving slowly or fidgety/restless - 1  Suicidal thoughts - 0  PHQ-9 Score - 15  Difficult doing work/chores - Somewhat difficult    Review of Systems  Constitutional: Negative.   HENT: Negative.   Eyes: Negative.   Respiratory: Negative.   Cardiovascular: Negative.   Gastrointestinal: Negative.   Endocrine: Negative.   Genitourinary: Negative.   Musculoskeletal: Negative.   Skin: Negative.   Allergic/Immunologic: Negative.   Neurological: Negative.   Hematological: Negative.   Psychiatric/Behavioral: Negative.   All other systems reviewed and are negative.      Objective:   Physical Exam  Constitutional: She is oriented to person, place, and time. She appears well-developed and well-nourished.  HENT:  Head: Normocephalic and atraumatic.  Eyes: Conjunctivae and EOM are normal. Pupils are equal, round, and reactive to light.  Neck: Normal range of motion.  Musculoskeletal:       Right shoulder: She exhibits pain. She exhibits normal range of motion and no deformity.       Left shoulder: She exhibits normal range of motion, no bony tenderness and no deformity.  Negative impingement. Straight sign. Bilateral shoulder. Does have some pain with range of motion bilaterally  Neurological: She is alert and oriented to person, place, and time. Gait abnormal. Coordination normal.  Mild Trendelenburg with right hip abductor weakness Quad is 3 plus on the right, ankle dorsiflexors, 0 on the right, left Quad left ankle dorsiflexor, left hip flexor 5/5  Motor strength is 5/5 bilateral deltoid, biceps, triceps, grip  Skin: Skin is warm and dry.  Psychiatric: She has a normal mood and affect. Her speech is normal and behavior is normal. Judgment and thought content normal. Cognition and memory are normal.  Nursing note and vitals reviewed. Right quad and right leg atrophy. Upper extremities  show no evidence of atrophy. Left arm. 10 cm above the olecranon is 25 cm circumference, right arm at same landmarks, 25 cm circumference        Assessment & Plan:  1. Post polio syndrome. Patient noting some occasional balance deficits, likely from mildly progressing weakness. We will make referral to post polio clinic. Has been seen in the past by Dr. Clearnce Sorrel in [place]  2. Bilateral glenohumeral and acromioclavicular arthritis, but has more problems during functional tasks on the left side. Shoulder injectionLeft    Indication:Left Shoulder pain due to osteoarthritisnot relieved by medication management and other conservative care.  Informed consent was obtained after describing risks and benefits of the procedure with the patient, this includes bleeding, bruising, infection and medication side effects. The patient wishes to proceed and has given written consent. Patient was placed in a seated position. TheLeft shoulder was marked and prepped with betadine in the subacromial area. A 25-gauge 1-1/2 inch needle was inserted into the subacromial area. After negative draw back for blood, a solution containing 1 mL of 6 mg per ML betamethasone and 4 mL of 1% lidocaine was injected. A band aid was applied. The patient tolerated the procedure well. Post procedure instructions were given.  3. Lumbar spondylosis, improved after L3, L4, L5 medial branch blocks performed in November 2017. No reason for reinjection at the current time.

## 2017-01-18 NOTE — Patient Instructions (Signed)
We can inject the right shoulder next month. If we need to. Please call. Please call if your back needs another injection. See me in 6 months otherwise

## 2017-07-09 ENCOUNTER — Encounter: Payer: Self-pay | Admitting: Physical Medicine & Rehabilitation

## 2017-07-09 ENCOUNTER — Encounter: Payer: BC Managed Care – PPO | Attending: Physical Medicine & Rehabilitation

## 2017-07-09 ENCOUNTER — Ambulatory Visit (HOSPITAL_BASED_OUTPATIENT_CLINIC_OR_DEPARTMENT_OTHER): Payer: BC Managed Care – PPO | Admitting: Physical Medicine & Rehabilitation

## 2017-07-09 VITALS — BP 160/104 | HR 75 | Resp 98

## 2017-07-09 DIAGNOSIS — M47817 Spondylosis without myelopathy or radiculopathy, lumbosacral region: Secondary | ICD-10-CM

## 2017-07-09 DIAGNOSIS — G14 Postpolio syndrome: Secondary | ICD-10-CM | POA: Insufficient documentation

## 2017-07-09 MED ORDER — TRAMADOL HCL 50 MG PO TABS: 50.0000 mg | ORAL_TABLET | Freq: Three times a day (TID) | ORAL | 5 refills | Status: DC | PRN

## 2017-07-09 NOTE — Progress Notes (Signed)
Subjective:    Patient ID: Carrie Travis, female    DOB: Feb 11, 1951, 66 y.o.   MRN: [id number]  HPI Chief complaint is increasing low back pain. 66 year old female with history of postpolio syndrome, mainly affecting the right lower limb. She has a history of lumbar spondylosis and has had good relief in the past with lumbar medial branch blocks L3-L4 as well as L5 dorsal ramus injection last performed in November 2017. She has had no falls or trauma. She has had no weakness in the lower limbs other than her chronic right lower extremity weakness. She continues to worry her KAFO Pain Inventory Average Pain 7 Pain Right Now 8 My pain is constant, burning and aching  In the last 24 hours, has pain interfered with the following? General activity 6 Relation with others 7 Enjoyment of life 5 What TIME of day is your pain at its worst? evening Sleep (in general) Poor  Pain is worse with: sitting, inactivity and some activites Pain improves with: . Relief from Meds: 9  Mobility walk without assistance ability to climb steps?  no do you drive?  yes  Function employed # of hrs/week 37 I need assistance with the following:  household duties  Neuro/Psych weakness numbness depression anxiety  Prior Studies Any changes since last visit?  no  Physicians involved in your care Any changes since last visit?  no   No family history on file. Social History   Social History  . Marital status: Single    Spouse name: N/A  . Number of children: N/A  . Years of education: N/A   Social History Main Topics  . Smoking status: Never Smoker  . Smokeless tobacco: Never Used  . Alcohol use Not on file  . Drug use: Unknown  . Sexual activity: Not on file   Other Topics Concern  . Not on file   Social History Narrative  . No narrative on file   No past surgical history on file. No past medical history on file. There were no vitals taken for this visit.  Opioid Risk Score:     Fall Risk Score:  `1  Depression screen PHQ 2/9  Depression screen [facility] 2/9 03/12/2016 09/09/2015  Decreased Interest 0 2  Down, Depressed, Hopeless 0 1  PHQ - 2 Score 0 3  Altered sleeping - 3  Tired, decreased energy - 3  Change in appetite - 3  Feeling bad or failure about yourself  - 1  Trouble concentrating - 1  Moving slowly or fidgety/restless - 1  Suicidal thoughts - 0  PHQ-9 Score - 15  Difficult doing work/chores - Somewhat difficult     Review of Systems  Constitutional: Positive for diaphoresis.  HENT: Negative.   Eyes: Negative.   Respiratory: Negative.   Cardiovascular: Negative.   Gastrointestinal: Negative.   Endocrine: Negative.   Genitourinary: Negative.   Musculoskeletal: Negative.   Skin: Negative.   Allergic/Immunologic: Negative.   Neurological: Negative.   Hematological: Negative.   Psychiatric/Behavioral: Negative.   All other systems reviewed and are negative.      Objective:   Physical Exam  Constitutional: She is oriented to person, place, and time. She appears well-developed and well-nourished. No distress.  HENT:  Head: Normocephalic and atraumatic.  Eyes: Pupils are equal, round, and reactive to light. Conjunctivae and EOM are normal.  Neurological: She is alert and oriented to person, place, and time.  Skin: She is not diaphoretic.  Psychiatric: She has a normal mood  and affect.  Nursing note and vitals reviewed.   Motor strength is 3 at the right hip flexor, knee extensor, ankle dorsiflexor. Left side is 5 in the hip flexion, extension and lateral flexion. Negative straight leg raising. Lumbar spine. Mild tenderness to palpation lumbar paraspinals in the lumbosacral junction area. There is mild tenderness. Palpation over the bilateral greater trochanters of the hip.       Assessment & Plan:  1. Lumbar spondylosis without myelopathy, she gets a prolonged relief with L3, L4 medial branch and L5 dorsal ramus injections, last  injection performed about one year ago. She states she could have used another injection approximately 3 months ago, but has held off due to financial reasons, this has been resolved, so she will schedule as soon as possible Continue tramadol 50 mg 3 times a day when necessary

## 2017-07-25 ENCOUNTER — Encounter: Payer: Self-pay | Admitting: Physical Medicine & Rehabilitation

## 2017-07-25 ENCOUNTER — Encounter: Payer: BC Managed Care – PPO | Attending: Physical Medicine & Rehabilitation

## 2017-07-25 ENCOUNTER — Ambulatory Visit (HOSPITAL_BASED_OUTPATIENT_CLINIC_OR_DEPARTMENT_OTHER): Payer: BC Managed Care – PPO | Admitting: Physical Medicine & Rehabilitation

## 2017-07-25 VITALS — BP 161/89 | HR 76

## 2017-07-25 DIAGNOSIS — G14 Postpolio syndrome: Secondary | ICD-10-CM | POA: Insufficient documentation

## 2017-07-25 DIAGNOSIS — M47817 Spondylosis without myelopathy or radiculopathy, lumbosacral region: Secondary | ICD-10-CM

## 2017-07-25 NOTE — Progress Notes (Deleted)
   Subjective:    Patient ID: Carrie Travis, female    DOB: 1950/10/20, 66 y.o.   MRN: [id number]  HPI Pain Inventory Average Pain 8 Pain Right Now 8 My pain is burning, dull and aching  In the last 24 hours, has pain interfered with the following? General activity 5 Relation with others 8 Enjoyment of life 7 What TIME of day is your pain at its worst? morning and night Sleep (in general) Fair  Pain is worse with: walking, standing and some activites Pain improves with: heat/ice, medication and injections Relief from Meds: 7  Mobility walk without assistance ability to climb steps?  yes do you drive?  yes transfers alone  Function employed # of hrs/week 37  Neuro/Psych dizziness anxiety  Prior Studies Any changes since last visit?  no  Physicians involved in your care Any changes since last visit?  no   No family history on file. Social History   Social History  . Marital status: Single    Spouse name: N/A  . Number of children: N/A  . Years of education: N/A   Social History Main Topics  . Smoking status: Never Smoker  . Smokeless tobacco: Never Used  . Alcohol use Not on file  . Drug use: Unknown  . Sexual activity: Not on file   Other Topics Concern  . Not on file   Social History Narrative  . No narrative on file   No past surgical history on file. No past medical history on file. There were no vitals taken for this visit.  Opioid Risk Score:   Fall Risk Score:  `1  Depression screen PHQ 2/9  [facility] 07/25/2017 03/12/2016 09/09/2015  Decreased Interest 0 0 2  Down, Depressed, Hopeless 0 0 1  PHQ - 2 Score 0 0 3  Altered sleeping - - 3  Tired, decreased energy - - 3  Change in appetite - - 3  Feeling bad or failure about yourself  - - 1  Trouble concentrating - - 1  Moving slowly or fidgety/restless - - 1  Suicidal thoughts - - 0  PHQ-9 Score - - 15  Difficult doing work/chores - - Somewhat difficult       Review of Systems     Objective:   Physical Exam        Assessment & Plan:

## 2017-07-25 NOTE — Progress Notes (Signed)
Bilateral Lumbar L3, L4  medial branch blocks and L 5 dorsal ramus injection under fluoroscopic guidance  Indication: Lumbar pain which is not relieved by medication management or other conservative care and interfering with self-care and mobility.  Informed consent was obtained after describing risks and benefits of the procedure with the patient, this includes bleeding, infection, paralysis and medication side effects.  The patient wishes to proceed and has given written consent.  The patient was placed in prone position.  The lumbar area was marked and prepped with Betadine.  One mL of 1% lidocaine was injected into each of 6 areas into the skin and subcutaneous tissue.  Then a 22-gauge 3.5in spinal needle was inserted targeting the junction of the left S1 superior articular process and sacral ala junction. Needle was advanced under fluoroscopic guidance.  Bone contact was made.  Isovue 200 was injected x 0.5 mL demonstrating no intravascular uptake.  Then a solution  of 2% MPF lidocaine was injected x 0.5 mL.  Then the left L5 superior articular process in transverse process junction was targeted.  Bone contact was made.  Isovue 200 was injected x 0.5 mL demonstrating no intravascular uptake. Then a solution containing  2% MPF lidocaine was injected x 0.5 mL.  Then the left L4 superior articular process in transverse process junction was targeted.  Bone contact was made.  Isovue 200 was injected x 0.5 mL demonstrating no intravascular uptake.  Then a solution containing2% MPF lidocaine was injected x 0.5 mL.  This same procedure was performed on the right side using the same needle, technique and injectate.  Patient tolerated procedure well.  Post procedure instructions were given.  

## 2017-07-25 NOTE — Patient Instructions (Signed)

## 2017-07-25 NOTE — Progress Notes (Signed)
  PROCEDURE RECORD [facility]   Name: Carrie Travis DOB:1951-08-16 MRN: [id number]  Date:07/25/2017  Physician: Claudette LawsAndrew Kirsteins, MD    Nurse/CMA: Wessling, CMA  Allergies: No Known Allergies  Consent Signed: Yes.    Is patient diabetic? No.  CBG today? n/a  Pregnant: No. LMP: No LMP recorded. Patient is postmenopausal. (age 68-55)  Anticoagulants: no Anti-inflammatory: no Antibiotics: no  Procedure: bilateral medial branch block  Position: Prone Start Time: 9:51am  End Time: 10:03am  Fluoro Time: 42  RN/CMA Hines Kloss, CMA Wessling, CMA    Time 9:22am 10:07    BP 161/89 184/87    Pulse 81 71    Respirations 14 14    O2 Sat 98 98    S/S 6 6    Pain Level 7/10 5/10     D/C home with husband patient A & O X 3, D/C instructions reviewed, and sits independently.

## 2017-12-19 ENCOUNTER — Ambulatory Visit: Payer: BC Managed Care – PPO | Admitting: Physical Medicine & Rehabilitation

## 2017-12-19 ENCOUNTER — Encounter: Payer: BC Managed Care – PPO | Attending: Physical Medicine & Rehabilitation

## 2017-12-19 ENCOUNTER — Encounter: Payer: Self-pay | Admitting: Physical Medicine & Rehabilitation

## 2017-12-19 VITALS — BP 205/97 | HR 94 | Ht <= 58 in | Wt 98.0 lb

## 2017-12-19 DIAGNOSIS — Z5181 Encounter for therapeutic drug level monitoring: Secondary | ICD-10-CM

## 2017-12-19 DIAGNOSIS — M47816 Spondylosis without myelopathy or radiculopathy, lumbar region: Secondary | ICD-10-CM | POA: Diagnosis not present

## 2017-12-19 DIAGNOSIS — F1220 Cannabis dependence, uncomplicated: Secondary | ICD-10-CM | POA: Insufficient documentation

## 2017-12-19 DIAGNOSIS — Z79891 Long term (current) use of opiate analgesic: Secondary | ICD-10-CM | POA: Insufficient documentation

## 2017-12-19 DIAGNOSIS — M47817 Spondylosis without myelopathy or radiculopathy, lumbosacral region: Secondary | ICD-10-CM

## 2017-12-19 DIAGNOSIS — Z79899 Other long term (current) drug therapy: Secondary | ICD-10-CM | POA: Diagnosis not present

## 2017-12-19 DIAGNOSIS — G14 Postpolio syndrome: Secondary | ICD-10-CM | POA: Diagnosis not present

## 2017-12-19 MED ORDER — TRAMADOL HCL 50 MG PO TABS: 50.0000 mg | ORAL_TABLET | Freq: Three times a day (TID) | ORAL | 0 refills | Status: DC | PRN

## 2017-12-19 NOTE — Progress Notes (Signed)
Subjective:    Patient ID: Carrie Travis, female    DOB: 1950/12/10, 67 y.o.   MRN: [id number]  HPI B L3-4-5 MBB 11/18 doing well Working full time for special needs Takes tramadol  Takes excedrin for HA,  Walks a lot at school  Discussed marijuana use which pt states is for appetite and sleep Discussed that opioid cannot be prescribed by this office if pt actively using THC Pain Inventory Average Pain 1 Pain Right Now 0 My pain is na  In the last 24 hours, has pain interfered with the following? General activity 2 Relation with others 2 Enjoyment of life 2 What TIME of day is your pain at its worst? na Sleep (in general) Fair  Pain is worse with: na Pain improves with: heat/ice, medication and injections Relief from Meds: na  Mobility walk without assistance ability to climb steps?  no do you drive?  yes  Function employed # of hrs/week 37.5 I need assistance with the following:  household duties  Neuro/Psych bladder control problems depression anxiety  Prior Studies Any changes since last visit?  no  Physicians involved in your care Any changes since last visit?  no   No family history on file. Social History   Socioeconomic History  . Marital status: Single    Spouse name: Not on file  . Number of children: Not on file  . Years of education: Not on file  . Highest education level: Not on file  Occupational History  . Not on file  Social Needs  . Financial resource strain: Not on file  . Food insecurity:    Worry: Not on file    Inability: Not on file  . Transportation needs:    Medical: Not on file    Non-medical: Not on file  Tobacco Use  . Smoking status: Never Smoker  . Smokeless tobacco: Never Used  Substance and Sexual Activity  . Alcohol use: Not on file  . Drug use: Not on file  . Sexual activity: Not on file  Lifestyle  . Physical activity:    Days per week: Not on file    Minutes per session: Not on file  . Stress: Not on  file  Relationships  . Social connections:    Talks on phone: Not on file    Gets together: Not on file    Attends religious service: Not on file    Active member of club or organization: Not on file    Attends meetings of clubs or organizations: Not on file    Relationship status: Not on file  Other Topics Concern  . Not on file  Social History Narrative  . Not on file   No past surgical history on file. No past medical history on file. There were no vitals taken for this visit.  Opioid Risk Score:   Fall Risk Score:  `1  Depression screen PHQ 2/9  Depression screen [facility] 2/9 07/25/2017 03/12/2016 09/09/2015  Decreased Interest 0 0 2  Down, Depressed, Hopeless 0 0 1  PHQ - 2 Score 0 0 3  Altered sleeping - - 3  Tired, decreased energy - - 3  Change in appetite - - 3  Feeling bad or failure about yourself  - - 1  Trouble concentrating - - 1  Moving slowly or fidgety/restless - - 1  Suicidal thoughts - - 0  PHQ-9 Score - - 15  Difficult doing work/chores - - Somewhat difficult     Review  of Systems  Constitutional: Positive for diaphoresis and unexpected weight change.  HENT: Negative.   Eyes: Negative.   Respiratory: Negative.   Cardiovascular: Negative.   Gastrointestinal: Positive for constipation.  Endocrine: Negative.   Genitourinary: Negative.   Musculoskeletal: Negative.   Skin: Negative.   Allergic/Immunologic: Negative.   Neurological: Negative.   Hematological: Negative.   Psychiatric/Behavioral: Negative.   All other systems reviewed and are negative.      Objective:   Physical Exam  Constitutional: She is oriented to person, place, and time. She appears well-developed and well-nourished. No distress.  HENT:  Head: Normocephalic and atraumatic.  Eyes: Pupils are equal, round, and reactive to light. Conjunctivae and EOM are normal.  Musculoskeletal:       Lumbar back: She exhibits normal range of motion, no tenderness, no deformity and no spasm.    Neurological: She is alert and oriented to person, place, and time. No sensory deficit.  Motor 5/5 in LLE 4/5 R HF, KE, ADF trace  Skin: Skin is warm and dry. She is not diaphoretic.  Psychiatric: She has a normal mood and affect. Her behavior is normal. Thought content normal.  Nursing note and vitals reviewed.          Assessment & Plan:  1.  Lumbar spondylosis without myelopathy.  Pain interferes with fxn Did well with MBB discussed this may be repeated  Indication for chronic opioid: Lumbar spondylosis Medication and dose: Tramadol 50mg  TID # pills per month: 90 Last UDS date: 12/19/17 Opioid Treatment Agreement signed (Y/N): Y Opioid Treatment Agreement last reviewed with patient:   12/19/17   [facility] reviewed this encounter (include red flags):  12/19/17  If UDS shows +THC , this will be last tramadol Rx

## 2017-12-19 NOTE — Patient Instructions (Signed)
If you need another injection please call

## 2017-12-26 ENCOUNTER — Telehealth: Payer: Self-pay | Admitting: *Deleted

## 2017-12-26 LAB — TOXASSURE SELECT,+ANTIDEPR,UR

## 2017-12-26 NOTE — Telephone Encounter (Signed)
Please call to infrom that no further tramadol would be available from this office.  We can continue doing injections and non narcotic meds

## 2017-12-26 NOTE — Telephone Encounter (Signed)
Urine drug screen is positive for alcohol and THC as well as her tramadol.

## 2017-12-27 NOTE — Telephone Encounter (Signed)
Letter mailed with notification and flag placed in FYI for non narcotic treatment only [facility]. Tramadol discontinued form med list.

## 2018-06-20 ENCOUNTER — Ambulatory Visit: Payer: BC Managed Care – PPO | Admitting: Physical Medicine & Rehabilitation
